# Patient Record
Sex: Female | Born: 1990 | Race: Black or African American | Hispanic: No | Marital: Single | Smoking: Former smoker
Health system: Southern US, Community
[De-identification: ages and names within clinical notes are randomized; demographics above are authoritative.]

## PROBLEM LIST (undated history)

## (undated) ENCOUNTER — Ambulatory Visit: Admission: RE | Payer: Medicaid Other | Source: Ambulatory Visit

## (undated) DIAGNOSIS — F1910 Other psychoactive substance abuse, uncomplicated: Secondary | ICD-10-CM

## (undated) DIAGNOSIS — R87629 Unspecified abnormal cytological findings in specimens from vagina: Secondary | ICD-10-CM

## (undated) DIAGNOSIS — O09299 Supervision of pregnancy with other poor reproductive or obstetric history, unspecified trimester: Secondary | ICD-10-CM

## (undated) DIAGNOSIS — Z8759 Personal history of other complications of pregnancy, childbirth and the puerperium: Secondary | ICD-10-CM

## (undated) HISTORY — PX: COLPOSCOPY: SHX161

---
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
## 2011-02-22 DIAGNOSIS — O09299 Supervision of pregnancy with other poor reproductive or obstetric history, unspecified trimester: Secondary | ICD-10-CM

## 2011-02-22 HISTORY — DX: Supervision of pregnancy with other poor reproductive or obstetric history, unspecified trimester: O09.299

## 2011-04-22 ENCOUNTER — Ambulatory Visit: Payer: Self-pay | Admitting: Advanced Practice Midwife

## 2011-06-30 ENCOUNTER — Encounter: Payer: Self-pay | Admitting: Obstetrics & Gynecology

## 2011-07-08 DIAGNOSIS — IMO0002 Reserved for concepts with insufficient information to code with codable children: Secondary | ICD-10-CM

## 2011-08-10 DIAGNOSIS — Z720 Tobacco use: Secondary | ICD-10-CM | POA: Insufficient documentation

## 2014-01-15 LAB — OB RESULTS CONSOLE HGB/HCT, BLOOD
HEMATOCRIT: 39 %
Hemoglobin: 13.6 g/dL

## 2014-01-15 LAB — OB RESULTS CONSOLE VARICELLA ZOSTER ANTIBODY, IGG: Varicella: IMMUNE

## 2014-01-15 LAB — OB RESULTS CONSOLE RPR: RPR: NONREACTIVE

## 2014-01-15 LAB — OB RESULTS CONSOLE HIV ANTIBODY (ROUTINE TESTING): HIV: NONREACTIVE

## 2014-01-15 LAB — OB RESULTS CONSOLE ABO/RH: RH Type: POSITIVE

## 2014-01-15 LAB — OB RESULTS CONSOLE RUBELLA ANTIBODY, IGM: Rubella: IMMUNE

## 2014-01-15 LAB — OB RESULTS CONSOLE ANTIBODY SCREEN: Antibody Screen: NEGATIVE

## 2014-01-15 LAB — OB RESULTS CONSOLE GC/CHLAMYDIA
CHLAMYDIA, DNA PROBE: NEGATIVE
Gonorrhea: NEGATIVE

## 2014-01-15 LAB — OB RESULTS CONSOLE HEPATITIS B SURFACE ANTIGEN: HEP B S AG: NEGATIVE

## 2014-01-17 LAB — OB RESULTS CONSOLE GC/CHLAMYDIA
Chlamydia: NEGATIVE
GC PROBE AMP, GENITAL: NEGATIVE

## 2014-01-23 ENCOUNTER — Encounter: Payer: Self-pay | Admitting: Obstetrics and Gynecology

## 2014-02-03 ENCOUNTER — Emergency Department: Payer: Self-pay | Admitting: Emergency Medicine

## 2014-02-05 ENCOUNTER — Emergency Department: Payer: Self-pay | Admitting: Emergency Medicine

## 2014-02-05 LAB — CBC
HCT: 42.8 % (ref 35.0–47.0)
HGB: 14.1 g/dL (ref 12.0–16.0)
MCH: 29.2 pg (ref 26.0–34.0)
MCHC: 32.8 g/dL (ref 32.0–36.0)
MCV: 89 fL (ref 80–100)
Platelet: 364 10*3/uL (ref 150–440)
RBC: 4.81 10*6/uL (ref 3.80–5.20)
RDW: 12.9 % (ref 11.5–14.5)
WBC: 14.9 10*3/uL — AB (ref 3.6–11.0)

## 2014-02-05 LAB — HCG, QUANTITATIVE, PREGNANCY: Beta Hcg, Quant.: 141417 m[IU]/mL — ABNORMAL HIGH

## 2014-02-06 ENCOUNTER — Encounter: Payer: Self-pay | Admitting: Obstetrics and Gynecology

## 2014-02-06 LAB — URINALYSIS, COMPLETE
BLOOD: NEGATIVE
Bacteria: NONE SEEN
Bilirubin,UR: NEGATIVE
Glucose,UR: NEGATIVE mg/dL (ref 0–75)
KETONE: NEGATIVE
Nitrite: NEGATIVE
PH: 6 (ref 4.5–8.0)
Protein: NEGATIVE
RBC,UR: 3 /HPF (ref 0–5)
Specific Gravity: 1.031 (ref 1.003–1.030)
WBC UR: 5 /HPF (ref 0–5)

## 2014-02-06 LAB — GC/CHLAMYDIA PROBE AMP

## 2014-02-06 LAB — WET PREP, GENITAL

## 2014-02-21 DIAGNOSIS — Z8759 Personal history of other complications of pregnancy, childbirth and the puerperium: Secondary | ICD-10-CM

## 2014-02-21 HISTORY — DX: Personal history of other complications of pregnancy, childbirth and the puerperium: Z87.59

## 2014-02-21 NOTE — L&D Delivery Note (Signed)
Delivery Note At 5:21 PM a viable unspecified sex was delivered via Vaginal, Spontaneous Delivery (Presentation:vertex).  APGAR: 8, 9; weight 4 lb 12.5 oz (2169 g).   Placenta status: Intact, Spontaneous Sent to Pathology d/t IUGR.  Cord: 3 vessels with the following complications: None.  Cord pH: N/A  Anesthesia: Epidural  Episiotomy: None Lacerations: None Suture Repair: N/A Est. Blood Loss (mL): 300  Approx 50 minute second stage. Infant to maternal abdomen for delayed cord clamping, cut by FOB after pulsation stopped. Infant to bedside warmer for assessment by nursery staff per mother's request.   [name]  Mom to postpartum.  Baby to Couplet care / Skin to Skin.  Kimberly Shea, Kimberly Shea 07/28/2014, 5:48 PM

## 2014-03-20 ENCOUNTER — Encounter: Payer: Self-pay | Admitting: Obstetrics and Gynecology

## 2014-03-24 ENCOUNTER — Encounter: Payer: Self-pay | Admitting: Obstetrics and Gynecology

## 2014-05-12 ENCOUNTER — Encounter
Admit: 2014-05-12 | Disposition: A | Discharge status: Home / Self Care | Payer: Self-pay | Attending: Obstetrics & Gynecology | Admitting: Obstetrics & Gynecology

## 2014-05-22 ENCOUNTER — Encounter
Admit: 2014-05-22 | Disposition: A | Discharge status: Home / Self Care | Payer: Self-pay | Attending: Obstetrics and Gynecology | Admitting: Obstetrics and Gynecology

## 2014-05-23 ENCOUNTER — Encounter
Admit: 2014-05-23 | Disposition: A | Discharge status: Home / Self Care | Payer: Self-pay | Attending: Obstetrics and Gynecology | Admitting: Obstetrics and Gynecology

## 2014-05-30 LAB — OB RESULTS CONSOLE HIV ANTIBODY (ROUTINE TESTING): HIV: NONREACTIVE

## 2014-05-30 LAB — OB RESULTS CONSOLE PLATELET COUNT: Platelets: 331 10*3/uL

## 2014-05-30 LAB — OB RESULTS CONSOLE RPR: RPR: NONREACTIVE

## 2014-06-05 ENCOUNTER — Encounter
Admit: 2014-06-05 | Disposition: A | Discharge status: Home / Self Care | Payer: Self-pay | Attending: Obstetrics & Gynecology | Admitting: Obstetrics & Gynecology

## 2014-06-12 ENCOUNTER — Encounter
Admit: 2014-06-12 | Disposition: A | Discharge status: Home / Self Care | Payer: Self-pay | Attending: Maternal & Fetal Medicine | Admitting: Maternal & Fetal Medicine

## 2014-06-14 NOTE — Consult Note (Signed)
Referral Information:  Reason for Referral 23yo G2P0100 at [redacted]w[redacted]d by LMP of 11/11/13 who presents for consult due to a prior history of a pregnancy complicated by severe FGR and fetal demise at 28 weeks.   Referring Physician [facility]   Prenatal Hx Current pregnancy is with a different FOB and has thus far been uncomplicated.  Prior pregnancy was complicated by severe FGR (patient does not recall at what EGA this was diagnosed).  She was seen at [facility] at [redacted]w[redacted]d at which time EFW was <3rd%, cardiomegaly and pericardial effusions were seen, there was oligohydramnios, concern for placental thickening and abnormal cord Dopplers.  She was admitted to [facility] for monitoring and steroids.  She underwent an amniocentesis for karyotype and infectious studies (normal female karyotype, negative parvovirus and CMV).  She was discharged after 1 day per her request and 1 week later was diagnosed with an IUFD.  She returned to [facility] for induction of labor and at 110w6d delivery delivered a 510 gram stillborn infant.  She had a negative acquired thrombophilia work up.  She declined autopsy; placental path demonstrated accelerated maturation, infarct and thrombus within the parenchyma.  Postpartum she had a Nexplanon placed which was removed last year.   Past Obstetrical Hx 2013 - NVD of stillborn 510 gram infant at [redacted]w[redacted]d at [facility] 2015 - current   Home Medications: Medication Instructions Status  Prenatal Multivitamins oral tablet 1 tab(s) orally once a day Active   Allergies:   No Known Allergies:   Vital Signs/Notes:  Nursing Vital Signs: **Vital Signs.:   03-Dec-15 10:47  Vital Signs Type Routine  Temperature Temperature (F) 97.7  Celsius 36.5  Temperature Source oral  Pulse Pulse 92  Respirations Respirations 18  Systolic BP Systolic BP 136  Diastolic BP (mmHg) Diastolic BP (mmHg) 80  Mean BP 98  Pulse Ox % Pulse Ox % 98  Pulse Ox Activity Level  At rest  Oxygen Delivery Room Air/ 21 %    Perinatal Consult:  LMP 11-Nov-2013   PGyn Hx Per records, patient has had an abnormal PAP s/p LEEP and has had chlamydia in the past.   PMed Hx Hx of varicella, Rubella result not available   Past Medical History cont'd Denies   PSurg Hx Denies   FHx MGM diagnosed with breast cancer age 31; alive and well.  No other significant family history per patient and her mother.   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  Denies ETOH (although use was reported until she found out she was pregnant); no drugs   Review Of Systems:  Fever/Chills No   Cough No   Abdominal Pain No   Diarrhea No   Constipation No   Nausea/Vomiting No   SOB/DOE No   Chest Pain No   Dysuria No   Tolerating Diet Yes   Medications/Allergies Reviewed Medications/Allergies reviewed   Impression/Recommendations:  Impression 23yo G2P0 at [redacted]w[redacted]d with history of severe FGR and subsequent IUFD last pregnancy 1.  hx of FGR and IUFD 2.  smoker 3.  ? early ETOH exposure 4.  Mildly elevated BP this visit (normal at HD) 5.  No rubella result in prenatal records   Recommendations 1.  We discussed the workup that was performed in 2013 including ultrasound, karyotype, infectious studies and placental pathology.  Given the thrombus and infarcts seen  in the placenta and the severe FGR, we discussed the recommendation for starting a baby ASA.  Acquired thrombophilia w/up was negative.  We discussed the importance of early dating US and she was offered first trimester screening (scheduled in 2 weeks).  Recommend anatomy US at about 18 weeks and then a late 2nd trimester US for growth given her early presentation with FGR her prior pregnancy (consider around 26 weeks). 2.  She was encouraged to quit smoking, especially in light of the FGR in her prior pregnancy. 3.  Will  schedule genetic counseling at the time of her first trimester screen - pending the amount of ETOH exposure and when she discontinued, consider screening fetal echocardiogram 4.  Follow up BPs at prenatal appointments; if elevated, consider baseline preeclampsia labs. 5.  Check rubella immune status.   Plan:  Prenatal Diagnosis Options First trimester   Ultrasound at what gestational ages 3626 and 3932-34 weeks   Antepartum Testing pending results of ultrasound   Delivery Mode Vaginal   Additional Testing Folate/prenatal vitamins    Total Time Spent with Patient 30 minutes   >50% of visit spent in couseling/coordination of care yes   Office Use Only 99242  Level 2 (30min) NEW office consult exp prob focused   Coding Description: MATERNAL CONDITIONS/HISTORY INDICATION(S).   History of prior preg w/ FGR/IUGR.   History of prior preg w/ Oligo or Poly.   Previous stillborn or neonatal death - Poor Reproductive History.  Electronic Signatures: Kirby FunkEllestad, Sotiria Keast (MD)  (Signed 03-Dec-15 12:26)  Authored: Referral, Home Medications, Allergies, Vital Signs/Notes, Consult, Exam, Impression, Plan, Billing, Coding Description   Last Updated: 03-Dec-15 12:26 by Kirby FunkEllestad, Jalil Lorusso (MD)

## 2014-06-19 ENCOUNTER — Encounter
Admit: 2014-06-19 | Disposition: A | Discharge status: Home / Self Care | Payer: Self-pay | Attending: Obstetrics and Gynecology | Admitting: Obstetrics and Gynecology

## 2014-07-03 ENCOUNTER — Other Ambulatory Visit: Payer: Self-pay | Admitting: Maternal & Fetal Medicine

## 2014-07-03 ENCOUNTER — Ambulatory Visit
Admission: RE | Admit: 2014-07-03 | Discharge: 2014-07-03 | Disposition: A | Discharge status: Home / Self Care | Payer: Medicaid Other | Source: Ambulatory Visit | Attending: Maternal & Fetal Medicine | Admitting: Maternal & Fetal Medicine

## 2014-07-03 ENCOUNTER — Other Ambulatory Visit: Payer: Self-pay | Admitting: *Deleted

## 2014-07-03 ENCOUNTER — Ambulatory Visit: Payer: Self-pay

## 2014-07-03 DIAGNOSIS — IMO0002 Reserved for concepts with insufficient information to code with codable children: Secondary | ICD-10-CM

## 2014-07-03 DIAGNOSIS — O364XX0 Maternal care for intrauterine death, not applicable or unspecified: Secondary | ICD-10-CM | POA: Diagnosis not present

## 2014-07-03 DIAGNOSIS — O365990 Maternal care for other known or suspected poor fetal growth, unspecified trimester, not applicable or unspecified: Secondary | ICD-10-CM | POA: Insufficient documentation

## 2014-07-03 DIAGNOSIS — Z36 Encounter for antenatal screening of mother: Secondary | ICD-10-CM | POA: Insufficient documentation

## 2014-07-07 ENCOUNTER — Observation Stay: Payer: Medicaid Other

## 2014-07-07 ENCOUNTER — Observation Stay
Admission: EM | Admit: 2014-07-07 | Discharge: 2014-07-07 | Disposition: A | Discharge status: Home / Self Care | Payer: Medicaid Other | Attending: Obstetrics and Gynecology | Admitting: Obstetrics and Gynecology

## 2014-07-07 ENCOUNTER — Encounter: Payer: Self-pay | Admitting: *Deleted

## 2014-07-07 DIAGNOSIS — Z3A34 34 weeks gestation of pregnancy: Secondary | ICD-10-CM | POA: Diagnosis not present

## 2014-07-07 DIAGNOSIS — IMO0002 Reserved for concepts with insufficient information to code with codable children: Secondary | ICD-10-CM

## 2014-07-07 DIAGNOSIS — O368130 Decreased fetal movements, third trimester, not applicable or unspecified: Principal | ICD-10-CM | POA: Insufficient documentation

## 2014-07-07 DIAGNOSIS — O288 Other abnormal findings on antenatal screening of mother: Secondary | ICD-10-CM | POA: Diagnosis present

## 2014-07-07 HISTORY — DX: Unspecified abnormal cytological findings in specimens from vagina: R87.629

## 2014-07-07 MED ORDER — ACETAMINOPHEN 325 MG PO TABS: 650.0000 mg | ORAL_TABLET | ORAL | Status: DC | PRN

## 2014-07-07 NOTE — Progress Notes (Signed)
[facility] Pt with 8/8 on BPP with a reactive NST for a total of 10/10. [facility]. Pt with DPN appt Thursday.

## 2014-07-07 NOTE — Plan of Care (Signed)
[facility] and [facility]

## 2014-07-07 NOTE — Discharge Summary (Signed)
Patient discharged home, discharge instructions reviewed, pt states understanding, pt left floor in stable conditions, denies any other needs at this time

## 2014-07-07 NOTE — OB Triage Note (Signed)
Patient sent from office for nonreactive NST.

## 2014-07-07 NOTE — H&P (Signed)
Obstetric History and Physical  Sharyn DrossShanice E Raul Dellston is a 24 y.o. G2P0100 with IUP at [redacted]w[redacted]d presenting for non-reactive NST in the office with decreased fetal movement. Patient states she has been having  none contractions, none vaginal bleeding, intact membranes, with decreased  fetal movement.    Prenatal Course Source of Care: [facility]  with onset of care at 24 weeks, but had previous care at [facility] starting at 9 weeks.  Pregnancy complications or risks: Pt with a history of IUFD at 26 weeks from IUGR in a previous pregnancy [facility], and is a smoker.  Patient Active Problem List   Diagnosis Date Noted  . Non-reactive NST (non-stress test) 07/07/2014    Prenatal labs and studies: ABO, Rh: O/Positive/-- (11/25 0000) Antibody: Negative (11/25 0000) Rubella: Immune (11/25 0000) Varicella: Immune  RPR: Nonreactive (04/08 0000)  HBsAg: Negative (11/25 0000)  HIV: Non-reactive (04/08 0000)  GBS:  unknown  Genetic screening TETRA negative  Anatomy US normal but had FGR at 27 weeks that has resolved on 5/12 Tdap: 06/13/14 Flu: NA   Past Medical History  Diagnosis Date  . Vaginal Pap smear, abnormal     Past Surgical History  Procedure Laterality Date  . Leep      OB History  Gravida Para Term Preterm AB SAB TAB Ectopic Multiple Living  2 1  1       0    # Outcome Date GA Lbr Len/2nd Weight Sex Delivery Anes PTL Lv  2 Current           1 Preterm 07/08/11    M Vag-Spont EPI       Complications: Fetal demise     Comments: fetal demise      History   Social History  . Marital Status: Single    Spouse Name: N/A  . Number of Children: N/A  . Years of Education: N/A   Social History Main Topics  . Smoking status: Not on file  . Smokeless tobacco: Former NeurosurgeonUser  . Alcohol Use: No  . Drug Use: No  . Sexual Activity: Yes    Birth Control/ Protection: Condom   Other Topics Concern  . Not on file   Social  History Narrative    Family History  Problem Relation Age of Onset  . Alcohol abuse Neg Hx   . Arthritis Neg Hx   . Asthma Neg Hx   . Birth defects Neg Hx   . Cancer Neg Hx   . COPD Neg Hx   . Depression Neg Hx   . Drug abuse Neg Hx   . Hyperlipidemia Neg Hx   . Varicose Veins Neg Hx   . Vision loss Neg Hx   . Miscarriages / Stillbirths Neg Hx   . Learning disabilities Neg Hx   . Kidney disease Neg Hx   . Hypertension Neg Hx   . Hearing loss Neg Hx   . Diabetes Neg Hx   . Early death Neg Hx   . Heart disease Neg Hx   . Mental illness Neg Hx   . Mental retardation Neg Hx   . Stroke Neg Hx     No prescriptions prior to admission    No Known Allergies  Review of Systems: Negative except for what is mentioned in HPI.  Physical Exam: BP 128/80 mmHg  Pulse 99  Temp(Src) 98.1 F (36.7 C) (Oral)  Resp 18  Ht 5\' 5"  (1.651 m)  Wt 78.926 kg (174 lb)  BMI 28.96 kg/m2 GENERAL: Well-developed, well-nourished female in no acute distress.  LUNGS: Clear to auscultation bilaterally.  HEART: Regular rate and rhythm. ABDOMEN: Soft, nontender, nondistended, gravid. EXTREMITIES: Nontender, no edema, 2+ distal pulses.   Presentation: NA FHT:  Baseline rate 135 bpm   Variability moderate  Accelerations present   Decelerations none Contractions: none Pertinent Labs/Studies:   No results found for this or any previous visit (from the past 24 hour(s)).  Assessment : Lashica E Repass is a 24 y.o. G2P0100 at 2848w0d bRoselind Messiereing observed for non-reactive FHT    Plan: Dopplers and BPP performed. Being read by Dr. Dolphus JennySmall currently.  FWB: Reactive NST. Reassuring fetal heart tracing. Dispo: pending US report. IF normal [facility] and to have pt keep her prenatal appointment.   Jannet Mantisourtney Danish Ruffins, CNM [facility]

## 2014-07-07 NOTE — Plan of Care (Signed)
Alric Quan Subudhi, CNM consult with [facility], [facility].

## 2014-07-07 NOTE — Discharge Instructions (Signed)
Drink plenty of fluid and get plenty of rest. Call your provider for any other concerns °

## 2014-07-10 ENCOUNTER — Ambulatory Visit
Admission: RE | Admit: 2014-07-10 | Discharge: 2014-07-10 | Disposition: A | Discharge status: Home / Self Care | Payer: Medicaid Other | Source: Ambulatory Visit | Attending: Obstetrics and Gynecology | Admitting: Obstetrics and Gynecology

## 2014-07-10 ENCOUNTER — Other Ambulatory Visit: Payer: Self-pay

## 2014-07-10 DIAGNOSIS — Z3A33 33 weeks gestation of pregnancy: Secondary | ICD-10-CM | POA: Insufficient documentation

## 2014-07-10 DIAGNOSIS — O365930 Maternal care for other known or suspected poor fetal growth, third trimester, not applicable or unspecified: Secondary | ICD-10-CM | POA: Diagnosis present

## 2014-07-10 DIAGNOSIS — IMO0002 Reserved for concepts with insufficient information to code with codable children: Secondary | ICD-10-CM

## 2014-07-17 ENCOUNTER — Other Ambulatory Visit: Payer: Self-pay | Admitting: Maternal & Fetal Medicine

## 2014-07-17 ENCOUNTER — Ambulatory Visit
Admission: RE | Admit: 2014-07-17 | Discharge: 2014-07-17 | Disposition: A | Discharge status: Home / Self Care | Payer: Medicaid Other | Source: Ambulatory Visit | Attending: Maternal & Fetal Medicine | Admitting: Maternal & Fetal Medicine

## 2014-07-17 ENCOUNTER — Inpatient Hospital Stay
Admission: RE | Admit: 2014-07-17 | Discharge: 2014-07-17 | Disposition: A | Discharge status: Home / Self Care | Payer: Self-pay | Source: Ambulatory Visit | Attending: Maternal & Fetal Medicine | Admitting: Maternal & Fetal Medicine

## 2014-07-17 DIAGNOSIS — O365930 Maternal care for other known or suspected poor fetal growth, third trimester, not applicable or unspecified: Secondary | ICD-10-CM | POA: Insufficient documentation

## 2014-07-17 DIAGNOSIS — IMO0002 Reserved for concepts with insufficient information to code with codable children: Secondary | ICD-10-CM

## 2014-07-23 LAB — OB RESULTS CONSOLE GBS: GBS: NEGATIVE

## 2014-07-24 ENCOUNTER — Ambulatory Visit
Admission: RE | Admit: 2014-07-24 | Discharge: 2014-07-24 | Disposition: A | Discharge status: Home / Self Care | Payer: Medicaid Other | Source: Ambulatory Visit | Attending: Maternal & Fetal Medicine | Admitting: Maternal & Fetal Medicine

## 2014-07-24 DIAGNOSIS — O09293 Supervision of pregnancy with other poor reproductive or obstetric history, third trimester: Secondary | ICD-10-CM

## 2014-07-24 DIAGNOSIS — Z3A36 36 weeks gestation of pregnancy: Secondary | ICD-10-CM | POA: Insufficient documentation

## 2014-07-24 DIAGNOSIS — O09299 Supervision of pregnancy with other poor reproductive or obstetric history, unspecified trimester: Secondary | ICD-10-CM | POA: Diagnosis present

## 2014-07-24 DIAGNOSIS — IMO0002 Reserved for concepts with insufficient information to code with codable children: Secondary | ICD-10-CM

## 2014-07-24 LAB — US OB FOLLOW UP

## 2014-07-27 ENCOUNTER — Inpatient Hospital Stay
Admission: EM | Admit: 2014-07-27 | Discharge: 2014-07-30 | DRG: 775 | Disposition: A | Discharge status: Home / Self Care | Payer: Medicaid Other | Attending: Certified Nurse Midwife | Admitting: Certified Nurse Midwife

## 2014-07-27 DIAGNOSIS — O365930 Maternal care for other known or suspected poor fetal growth, third trimester, not applicable or unspecified: Secondary | ICD-10-CM | POA: Diagnosis present

## 2014-07-27 DIAGNOSIS — Z87891 Personal history of nicotine dependence: Secondary | ICD-10-CM

## 2014-07-27 DIAGNOSIS — Z7982 Long term (current) use of aspirin: Secondary | ICD-10-CM | POA: Diagnosis present

## 2014-07-27 DIAGNOSIS — Z3A37 37 weeks gestation of pregnancy: Secondary | ICD-10-CM | POA: Diagnosis present

## 2014-07-27 DIAGNOSIS — Z79899 Other long term (current) drug therapy: Secondary | ICD-10-CM

## 2014-07-27 DIAGNOSIS — Z3490 Encounter for supervision of normal pregnancy, unspecified, unspecified trimester: Secondary | ICD-10-CM

## 2014-07-27 HISTORY — DX: Supervision of pregnancy with other poor reproductive or obstetric history, unspecified trimester: O09.299

## 2014-07-27 LAB — CBC
HCT: 40.3 % (ref 35.0–47.0)
Hemoglobin: 13.6 g/dL (ref 12.0–16.0)
MCH: 28.6 pg (ref 26.0–34.0)
MCHC: 33.7 g/dL (ref 32.0–36.0)
MCV: 85 fL (ref 80.0–100.0)
PLATELETS: 263 10*3/uL (ref 150–440)
RBC: 4.74 MIL/uL (ref 3.80–5.20)
RDW: 13.7 % (ref 11.5–14.5)
WBC: 11.7 10*3/uL — ABNORMAL HIGH (ref 3.6–11.0)

## 2014-07-27 LAB — TYPE AND SCREEN
ABO/RH(D): O POS
Antibody Screen: NEGATIVE

## 2014-07-27 MED ORDER — OXYTOCIN 40 UNITS IN LACTATED RINGERS INFUSION - SIMPLE MED: 62.5000 mL/h | INTRAVENOUS | Status: DC

## 2014-07-27 MED ORDER — LIDOCAINE HCL (PF) 1 % IJ SOLN
30.0000 mL | INTRAMUSCULAR | Status: DC | PRN
  Filled 2014-07-27: qty 30

## 2014-07-27 MED ORDER — ONDANSETRON HCL 4 MG/2ML IJ SOLN
4.0000 mg | Freq: Four times a day (QID) | INTRAMUSCULAR | Status: DC | PRN
  Administered 2014-07-28: 4 mg via INTRAVENOUS

## 2014-07-27 MED ORDER — TRIAZOLAM 0.125 MG PO TABS: 0.2500 mg | ORAL_TABLET | Freq: Every evening | ORAL | Status: DC | PRN

## 2014-07-27 MED ORDER — AMMONIA AROMATIC IN INHA: 0.3000 mL | Freq: Once | RESPIRATORY_TRACT | Status: AC | PRN

## 2014-07-27 MED ORDER — LACTATED RINGERS IV SOLN
INTRAVENOUS | Status: DC
  Administered 2014-07-27 – 2014-07-28 (×2): via INTRAVENOUS

## 2014-07-27 MED ORDER — DINOPROSTONE 10 MG VA INST
10.0000 mg | VAGINAL_INSERT | Freq: Once | VAGINAL | Status: AC
  Administered 2014-07-27: 10 mg via VAGINAL
  Filled 2014-07-27: qty 1

## 2014-07-27 MED ORDER — OXYTOCIN BOLUS FROM INFUSION
500.0000 mL | INTRAVENOUS | Status: DC
  Administered 2014-07-28: 500 mL via INTRAVENOUS

## 2014-07-27 MED ORDER — LACTATED RINGERS IV SOLN
500.0000 mL | INTRAVENOUS | Status: DC | PRN
  Administered 2014-07-28: 500 mL via INTRAVENOUS
  Administered 2014-07-28: 250 mL via INTRAVENOUS

## 2014-07-27 MED ORDER — OXYTOCIN 10 UNIT/ML IJ SOLN: 10.0000 [IU] | Freq: Once | INTRAMUSCULAR | Status: DC

## 2014-07-27 MED ORDER — MISOPROSTOL 200 MCG PO TABS: 800.0000 ug | ORAL_TABLET | Freq: Once | ORAL | Status: AC | PRN

## 2014-07-27 MED ORDER — TERBUTALINE SULFATE 1 MG/ML IJ SOLN: 0.2500 mg | Freq: Once | INTRAMUSCULAR | Status: AC | PRN

## 2014-07-27 NOTE — H&P (Signed)
HISTORY AND PHYSICAL  HISTORY OF PRESENT ILLNESS: [name] is a 24 y.o. G2 P0100 with EDC=08/18/2014  by LMP consistent with 12 week ultrasound with a pregnancy complicated by concerns for FGR  presenting for induction of labor. Kimberly has a hx of a stillbirth  at 72 weeks with a baby with hydrops, FGR, oligohydraminos. Prenatal care begun at [facility] and transferred to [facility] at 24 weeks. POC co [facility] by [facility]. Antepartum testing begun at 28 weeks after growth scan placed EFW in the <5th %. BPPs and Dopplers have been normal. Earlier NSTs reactive with 10x10 accels, but have had some NR NSTs requiring prolonged monitoring. Weight gain this pregnancy around 15#.    REVIEW OF SYSTEMS: A complete review of systems was performed and was specifically negative for headache, changes in vision, RUQ pain, shortness of breath, chest pain, lower extremity edema and dysuria.   HISTORY:  Past Medical History  Diagnosis Date  . Vaginal Pap smear, abnormal   . History of stillbirth in currently pregnant patient   . History of IUGR (intrauterine growth retardation) and stillbirth, currently pregnant     Past Surgical History  Procedure Laterality Date  . Leep      No current facility-administered medications on file prior to encounter.   Current Outpatient Prescriptions on File Prior to Encounter  Medication Sig Dispense Refill  . aspirin 81 MG tablet Take 81 mg by mouth daily.    . Prenatal Vit-Fe Fumarate-FA (MULTIVITAMIN-PRENATAL) 27-0.8 MG TABS tablet Take 1 tablet by mouth daily at 12 noon.       No Known Allergies  OB History  Gravida Para Term Preterm AB SAB TAB Ectopic Multiple Living  0    # Outcome Date GA Lbr Len/2nd Weight Sex Delivery Anes PTL Lv  2 Current           1 Preterm 07/08/11 [redacted]w[redacted]d   M Vag-Spont EPI  FD     Complications: Fetal demise,Fetal hydrops,Oligohydramnios     Comments: fetal demise      Gynecologic History: History of Abnormal  Pap Smear: Yes-2008 LGSIL History of STI: No  History  Substance Use Topics  . Smoking status: Former Smoker    Types: Cigarettes  . Smokeless tobacco: Former Neurosurgeon  . Alcohol Use: No    PHYSICAL EXAM:    GENERAL: NAD AAOx3 CHEST:CTAB no increased work of breathing CV:RRR no appreciable murmurs, rubs, gallops ABDOMEN: gravid, nontender, EFW 5#5oz by [place] 4 days ago EXTREMITIES:  Warm and well-perfused, nontender, nonedematous, +1 DTRs CERVIX: 1.5/ 60%/ -1/vtx   FHT:150 baseline with moderate variability and accelerations to 170s and no decelerations  Toco: No contractions seen  DIAGNOSTIC STUDIES:   PRENATAL STUDIES:  Prenatal Labs:  MBT: O POS; Rubella immune (MMRx2), Varicella immune, HIV neg, RPR neg, Hep B neg, GC/CT neg, GBS neg, glucola 116  Last [place] 36wks 5#5oz (16th%ile)  AF wnl, normal anatomy  ASSESSMENT AND PLAN:  1. Fetal Well being  - Fetal Tracing: Cat 1-  - Group B Streptococcus: neg - Presentation: vtx confirmed by exam   2. Routine OB: - Prenatal labs reviewed, as above - Rh POS -TDAP given 4/22  3. Induction of Labor:  -  Bishop score 7 -  Pelvis adequate on exam -  Plan for cervical ripening with Cervidil. Explained risks of induction vs risks of continued observation. Aware of risks of hyperstimulation, fetal intolerance, C-section, and failed  induction. Patient adamant about proceeding with IOL.  4. Post Partum Planning: - Infant feeding: Breast- Contraception: condoms

## 2014-07-28 ENCOUNTER — Inpatient Hospital Stay: Payer: Medicaid Other | Admitting: Anesthesiology

## 2014-07-28 LAB — CHLAMYDIA/NGC RT PCR (ARMC ONLY)
Chlamydia Tr: NOT DETECTED
N GONORRHOEAE: NOT DETECTED

## 2014-07-28 LAB — ABO/RH: ABO/RH(D): O POS

## 2014-07-28 MED ORDER — FERROUS SULFATE 325 (65 FE) MG PO TABS
325.0000 mg | ORAL_TABLET | Freq: Every day | ORAL | Status: DC
  Administered 2014-07-29 – 2014-07-30 (×2): 325 mg via ORAL
  Filled 2014-07-28 (×2): qty 1

## 2014-07-28 MED ORDER — DIPHENHYDRAMINE HCL 50 MG/ML IJ SOLN: 12.5000 mg | INTRAMUSCULAR | Status: DC | PRN

## 2014-07-28 MED ORDER — OXYTOCIN 40 UNITS IN LACTATED RINGERS INFUSION - SIMPLE MED
INTRAVENOUS | Status: AC
  Filled 2014-07-28: qty 1000

## 2014-07-28 MED ORDER — DOCUSATE SODIUM 100 MG PO CAPS: 100.0000 mg | ORAL_CAPSULE | Freq: Two times a day (BID) | ORAL | Status: DC | PRN

## 2014-07-28 MED ORDER — PHENYLEPHRINE 40 MCG/ML (10ML) SYRINGE FOR IV PUSH (FOR BLOOD PRESSURE SUPPORT)
80.0000 ug | PREFILLED_SYRINGE | INTRAVENOUS | Status: DC | PRN
  Filled 2014-07-28: qty 2

## 2014-07-28 MED ORDER — FENTANYL 2.5 MCG/ML W/ROPIVACAINE 0.2% IN NS 100 ML EPIDURAL INFUSION (ARMC-ANES)
10.0000 mL/h | EPIDURAL | Status: DC
  Administered 2014-07-28: 10 mL/h via EPIDURAL

## 2014-07-28 MED ORDER — EPHEDRINE 5 MG/ML INJ
10.0000 mg | INTRAVENOUS | Status: DC | PRN
  Filled 2014-07-28: qty 2

## 2014-07-28 MED ORDER — OXYTOCIN 10 UNIT/ML IJ SOLN
INTRAMUSCULAR | Status: AC
  Filled 2014-07-28: qty 2

## 2014-07-28 MED ORDER — ONDANSETRON HCL 4 MG PO TABS: 4.0000 mg | ORAL_TABLET | ORAL | Status: DC | PRN

## 2014-07-28 MED ORDER — DIBUCAINE 1 % RE OINT: 1.0000 "application " | TOPICAL_OINTMENT | RECTAL | Status: DC | PRN

## 2014-07-28 MED ORDER — MISOPROSTOL 200 MCG PO TABS
ORAL_TABLET | ORAL | Status: AC
  Filled 2014-07-28: qty 4

## 2014-07-28 MED ORDER — BENZOCAINE-MENTHOL 20-0.5 % EX AERO: 1.0000 "application " | INHALATION_SPRAY | CUTANEOUS | Status: DC | PRN

## 2014-07-28 MED ORDER — BUPIVACAINE HCL (PF) 0.25 % IJ SOLN
INTRAMUSCULAR | Status: DC | PRN
  Administered 2014-07-28 (×2): 4 mL

## 2014-07-28 MED ORDER — SIMETHICONE 80 MG PO CHEW: 80.0000 mg | CHEWABLE_TABLET | ORAL | Status: DC | PRN

## 2014-07-28 MED ORDER — HYDROCODONE-ACETAMINOPHEN 5-325 MG PO TABS
1.0000 | ORAL_TABLET | Freq: Four times a day (QID) | ORAL | Status: DC | PRN
  Administered 2014-07-28: 1 via ORAL
  Administered 2014-07-29 – 2014-07-30 (×4): 2 via ORAL
  Filled 2014-07-28 (×4): qty 2
  Filled 2014-07-28: qty 1

## 2014-07-28 MED ORDER — PRENATAL 27-0.8 MG PO TABS
1.0000 | ORAL_TABLET | Freq: Every day | ORAL | Status: DC
  Filled 2014-07-28 (×4): qty 1

## 2014-07-28 MED ORDER — DIPHENHYDRAMINE HCL 25 MG PO CAPS: 25.0000 mg | ORAL_CAPSULE | Freq: Four times a day (QID) | ORAL | Status: DC | PRN

## 2014-07-28 MED ORDER — ONDANSETRON HCL 4 MG/2ML IJ SOLN
INTRAMUSCULAR | Status: AC
  Administered 2014-07-28: 4 mg via INTRAVENOUS
  Filled 2014-07-28: qty 2

## 2014-07-28 MED ORDER — WITCH HAZEL-GLYCERIN EX PADS: 1.0000 "application " | MEDICATED_PAD | CUTANEOUS | Status: DC | PRN

## 2014-07-28 MED ORDER — LACTATED RINGERS IV SOLN: INTRAVENOUS | Status: DC

## 2014-07-28 MED ORDER — IBUPROFEN 600 MG PO TABS
600.0000 mg | ORAL_TABLET | Freq: Four times a day (QID) | ORAL | Status: DC
  Administered 2014-07-28: 600 mg via ORAL
  Filled 2014-07-28: qty 1

## 2014-07-28 MED ORDER — LIDOCAINE HCL (PF) 1 % IJ SOLN
INTRAMUSCULAR | Status: AC
  Filled 2014-07-28: qty 30

## 2014-07-28 MED ORDER — IBUPROFEN 600 MG PO TABS
600.0000 mg | ORAL_TABLET | Freq: Four times a day (QID) | ORAL | Status: DC
  Administered 2014-07-29 – 2014-07-30 (×6): 600 mg via ORAL
  Filled 2014-07-28 (×6): qty 1

## 2014-07-28 MED ORDER — LANOLIN HYDROUS EX OINT: TOPICAL_OINTMENT | CUTANEOUS | Status: DC | PRN

## 2014-07-28 MED ORDER — FENTANYL 2.5 MCG/ML W/ROPIVACAINE 0.2% IN NS 100 ML EPIDURAL INFUSION (ARMC-ANES)
EPIDURAL | Status: AC
  Administered 2014-07-28: 10 mL/h via EPIDURAL
  Filled 2014-07-28: qty 100

## 2014-07-28 MED ORDER — AMMONIA AROMATIC IN INHA
RESPIRATORY_TRACT | Status: AC
  Filled 2014-07-28: qty 10

## 2014-07-28 MED ORDER — ZOLPIDEM TARTRATE 5 MG PO TABS: 5.0000 mg | ORAL_TABLET | Freq: Every evening | ORAL | Status: DC | PRN

## 2014-07-28 MED ORDER — ACETAMINOPHEN 325 MG PO TABS: 650.0000 mg | ORAL_TABLET | ORAL | Status: DC | PRN

## 2014-07-28 MED ORDER — ONDANSETRON HCL 4 MG/2ML IJ SOLN: 4.0000 mg | INTRAMUSCULAR | Status: DC | PRN

## 2014-07-28 MED ORDER — PRENATAL MULTIVITAMIN CH
1.0000 | ORAL_TABLET | Freq: Every day | ORAL | Status: DC
  Administered 2014-07-29 – 2014-07-30 (×2): 1 via ORAL

## 2014-07-28 MED ORDER — LIDOCAINE-EPINEPHRINE (PF) 1.5 %-1:200000 IJ SOLN
INTRAMUSCULAR | Status: DC | PRN
  Administered 2014-07-28: 3 mL

## 2014-07-28 NOTE — Progress Notes (Signed)
L&D Note  07/28/2014 - 1:56 PM  24 y.o. G2P0100 567w0d   [name] is admitted for IOL for IUGR and h/o IUFD   Subjective:  Comfortable with epidural, mild cramping in LLQ  Objective:   Filed Vitals:   07/28/14 1037 07/28/14 1137 07/28/14 1237 07/28/14 1302  BP: 119/75 125/72 121/76   Pulse: 88 92 100   Temp:    98.4 F (36.9 C)  TempSrc:    Oral  Resp:      Height:      Weight:      SpO2:        Current Vital Signs 24h Vital Sign Ranges  T 98.4 F (36.9 C) Temp  Avg: 98.4 F (36.9 C)  Min: 97.8 F (36.6 C)  Max: 99 F (37.2 C)  BP 121/76 mmHg BP  Min: 112/56  Max: 141/90  HR 100 Pulse  Avg: 84.9  Min: 72  Max: 100  RR 18 Resp  Avg: 18  Min: 18  Max: 18  SaO2 99 %   SpO2  Avg: 99 %  Min: 99 %  Max: 99 %       24 Hour I/O Current Shift I/O  Time Ins Outs 06/05 0701 - 06/06 0700 In: 567.5 [I.V.:567.5] Out: -       FHR: 140, min- mod variability, + accels, no decels Toco: q 2-4 min SVE: 9/100/-1, BBOW   Assessment :  IUP 37 wks, IOL for FGR    Plan:  Hold on AROM d/t conflicting emergency on unit.  Plan for delivery soon  Marta AntuBrothers, Mekhi Lascola, [place]

## 2014-07-28 NOTE — Anesthesia Preprocedure Evaluation (Addendum)
Anesthesia Evaluation  Patient identified by MRN, date of birth, ID band Patient awake    Reviewed: Allergy & Precautions, H&P , NPO status , Patient's Chart, lab work & pertinent test results, reviewed documented beta blocker date and time   History of Anesthesia Complications Negative for: history of anesthetic complications  Airway Mallampati: II  TM Distance: >3 FB Neck ROM: full    Dental no notable dental hx.    Pulmonary neg pulmonary ROS, former smoker,  breath sounds clear to auscultation  Pulmonary exam normal       Cardiovascular Exercise Tolerance: Good negative cardio ROS  Rhythm:regular Rate:Normal     Neuro/Psych negative neurological ROS  negative psych ROS   GI/Hepatic negative GI ROS, Neg liver ROS,   Endo/Other  negative endocrine ROS  Renal/GU negative Renal ROS  negative genitourinary   Musculoskeletal   Abdominal   Peds  Hematology negative hematology ROS (+)   Anesthesia Other Findings   Reproductive/Obstetrics (+) Pregnancy                             Anesthesia Physical Anesthesia Plan  ASA: II  Anesthesia Plan: Epidural   Post-op Pain Management:    Induction:   Airway Management Planned:   Additional Equipment:   Intra-op Plan:   Post-operative Plan:   Informed Consent: I have reviewed the patients History and Physical, chart, labs and discussed the procedure including the risks, benefits and alternatives for the proposed anesthesia with the patient or authorized representative who has indicated his/her understanding and acceptance.     Plan Discussed with:   Anesthesia Plan Comments:         Anesthesia Quick Evaluation

## 2014-07-28 NOTE — Progress Notes (Signed)
S: I couldn't take it anymore! Feels better after epidural, but nauseous Awoke with contractions around 0200, they became stronger at 0400 CX was 3.5cm dilated on RN exam so Cervidil was removed and epidural was obtained. O: BP 118/62 mmHg  Pulse 86  Temp(Src) 99 F (37.2 C) (Oral)  Resp 18  Ht 5\' 5"  (1.651 m)  Wt 78.926 kg (174 lb)  BMI 28.96 kg/m2  LMP 11/11/2013 FHR 135-145, no decels Toco: contractions q2-5 min CX: 4/80-90%/-2/BBOW A: Progressing P: Zofran for nausea Foley Monitor progress-consider AROM and IUPC Johnatan Baskette, CNM

## 2014-07-28 NOTE — Anesthesia Procedure Notes (Signed)
Epidural Patient location during procedure: OB Start time: 07/28/2014 5:40 AM End time: 07/28/2014 5:49 AM  Staffing Anesthesiologist: Forestine ChutePOLIN, Daelin Haste Performed by: anesthesiologist   Preanesthetic Checklist Completed: patient identified, site marked, surgical consent, pre-op evaluation, timeout performed, IV checked, risks and benefits discussed and monitors and equipment checked  Epidural Patient position: sitting Prep: Betadine Patient monitoring: heart rate and blood pressure Approach: midline Location: L3-L4 Injection technique: LOR saline  Needle:  Needle type: Tuohy  Needle gauge: 18 G Needle length: 9 cm Needle insertion depth: 6 cm Catheter type: closed end Catheter size: 20 Guage Catheter at skin depth: 11 cm Test dose: negative  Assessment Events: blood not aspirated, injection not painful and no paresthesia  Additional Notes Reason for block:procedure for pain

## 2014-07-29 LAB — RPR: RPR Ser Ql: NONREACTIVE

## 2014-07-29 LAB — HEMATOCRIT: HCT: 37.3 % (ref 35.0–47.0)

## 2014-07-29 NOTE — Anesthesia Postprocedure Evaluation (Signed)
  Anesthesia Post-op Note  Patient: Kimberly Shea  Procedure(s) Performed: * No procedures listed *  Anesthesia type:No value filed.  Patient location: PACU  Post pain: Pain level controlled  Post assessment: Post-op Vital signs reviewed, Patient's Cardiovascular Status Stable, Respiratory Function Stable, Patent Airway and No signs of Nausea or vomiting  Post vital signs: Reviewed and stable  Last Vitals:  Filed Vitals:   07/29/14 0430  BP: 104/66  Pulse: 78  Temp: 36.6 C  Resp: 18    Level of consciousness: awake, alert  and patient cooperative  Complications: No apparent anesthesia complications

## 2014-07-29 NOTE — Progress Notes (Signed)
Post Partum Day 1 from a SVD Subjective: Reports being tired.    Objective: Blood pressure 126/83, pulse 86, temperature 97.8 F (36.6 C), temperature source Oral, resp. rate 18, height 5\' 5"  (1.651 m), weight 78.926 kg (174 lb), last menstrual period 11/11/2013, SpO2 99 %, unknown if currently breastfeeding.  Pt with covers up by her face during the exam.   Physical Exam:  General: alert, cooperative and no distress Lochia: appropriate Uterine Fundus: firm Incision: NA DVT Evaluation: No evidence of DVT seen on physical exam.   Recent Labs  07/27/14 2124 07/29/14 0635  HGB 13.6  --   HCT 40.3 37.3    Assessment/Plan: Plan for discharge tomorrow and Contraception condoms   Pt switched to bottle feeding    LOS: 2 days   Jannet MantisSubudhi,  Josselyne Onofrio 07/29/2014, 10:42 AM

## 2014-07-30 LAB — SURGICAL PATHOLOGY

## 2014-07-30 MED ORDER — IBUPROFEN 600 MG PO TABS: 600.0000 mg | ORAL_TABLET | Freq: Four times a day (QID) | ORAL | Status: DC

## 2014-07-30 MED ORDER — HYDROCODONE-ACETAMINOPHEN 5-325 MG PO TABS: 1.0000 | ORAL_TABLET | Freq: Four times a day (QID) | ORAL | Status: DC | PRN

## 2014-07-30 NOTE — Discharge Summary (Signed)
Obstetrical Discharge Summary  Date of Admission: 07/27/2014 Date of Discharge: 07/30/2014 Primary OB: [facility]  Gestational Age at Delivery: 1374w0d   Antepartum complications: intrauterine growth restriction Reason for Admission: IOL for IUGR and h/o IUFD Date of Delivery: 07/28/14   Delivered By: T. Brothers, CNM Delivery Type: spontaneous vaginal delivery Intrapartum complications/course: None Anesthesia: epidural Placenta: spontaneous Laceration: none  Episiotomy: none Baby: Liveborn female, APGARs8/9, weight 2169 g/4#12.5 oz  Post partum course: uncomplicated. Pt discharged on PPD 2.  Disposition: Home  Rh Immune globulin given: not applicable Rubella vaccine given: not applicable Tdap vaccine given in AP or PP setting: yes Flu vaccine given in AP or PP setting: not applicable  Contraception: to be discussed- pt reports wanting to use condoms  Prenatal/Postnatal Panel: O POS//Rubella Immune//RPR negative//HIV negative/HepB Surface Ag negative//pap //plans to bottle feed  Plan:  Roselind MessierShanice E Vroom was discharged to home in good condition. [facility]   Discharge Medications:   Medication List    STOP taking these medications        aspirin 81 MG chewable tablet      TAKE these medications        HYDROcodone-acetaminophen 5-325 MG per tablet  Commonly known as:  NORCO/VICODIN  Take 1-2 tablets by mouth every 6 (six) hours as needed for severe pain.     ibuprofen 600 MG tablet  Commonly known as:  ADVIL,MOTRIN  Take 1 tablet (600 mg total) by mouth every 6 (six) hours.     multivitamin-prenatal 27-0.8 MG Tabs tablet  Take 1 tablet by mouth daily.        Jannet Mantisourtney Lacee Grey, CNM  [facility]

## 2014-07-30 NOTE — Progress Notes (Signed)
All discharge instructions given to patient and she voiced understanding of all instructions given. She will make her own 6 wk f/u appt.  Prescriptions given. Patient has been discharged but will remain here with infant as infant is now a pediatric patient due to high bili and is under lights.

## 2014-07-30 NOTE — Discharge Instructions (Signed)
Follow up sooner with fever, problems breathing, pain not helped by medications, severe depression( more than just baby blues, wanting to hurt yourself or the baby), severe bleeding ( saturating more than one pad an hour or large palm sized clots), no heavy lifting , no driving while taking narcotics, no douches, intercourse, tampons or enemas for 6 weeks  °

## 2014-07-31 ENCOUNTER — Other Ambulatory Visit: Payer: Self-pay

## 2014-08-07 ENCOUNTER — Other Ambulatory Visit: Payer: Self-pay

## 2014-08-14 ENCOUNTER — Ambulatory Visit: Payer: Self-pay

## 2014-08-21 ENCOUNTER — Other Ambulatory Visit: Payer: Self-pay

## 2016-02-22 NOTE — L&D Delivery Note (Addendum)
Delivery Note Primary OB: [facility] Delivery Physician: Annamarie Major, MD Gestational Age: Full term Antepartum complications: Delayed prenatal care, substance use, IUGR Intrapartum complications: None  A viable Female was delivered via vertex perentation.  Right lower extremity defect.  Apgars:5 lbs 9 oz  Weight:  pending .   Placenta status: spontaneous and Intact.  Cord: 3+ vessels;  with the following complications: none.  Anesthesia:  epidural Episiotomy:  none Lacerations:  none Suture Repair: none Est. Blood Loss (mL):  less than 50 mL  Mom to postpartum.  Baby to Couplet care / Skin to Skin.  Annamarie Major, MD, [facility], [facility] 12/02/2016  6:15 AM [phone]

## 2016-06-07 IMAGING — US US OB < 14 WEEKS
1 series · 14 of 28 positions shown · non-contrast
Comparison: None of this gestation

CLINICAL DATA: Vaginal bleeding in first trimester pregnancy

EXAM:
OBSTETRIC <14 WK ULTRASOUND
TECHNIQUE: Transabdominal ultrasound was performed for evaluation of the
gestation as well as the maternal uterus and adnexal regions.

[Series 1: us ob < 14 weeks · 0.20mm/px · 14 of 37 slices shown]
[im 2/37]
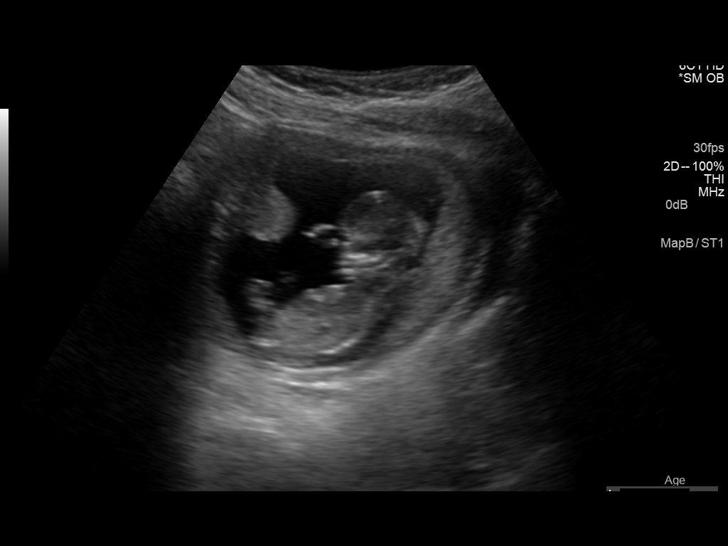
[im 5/37]
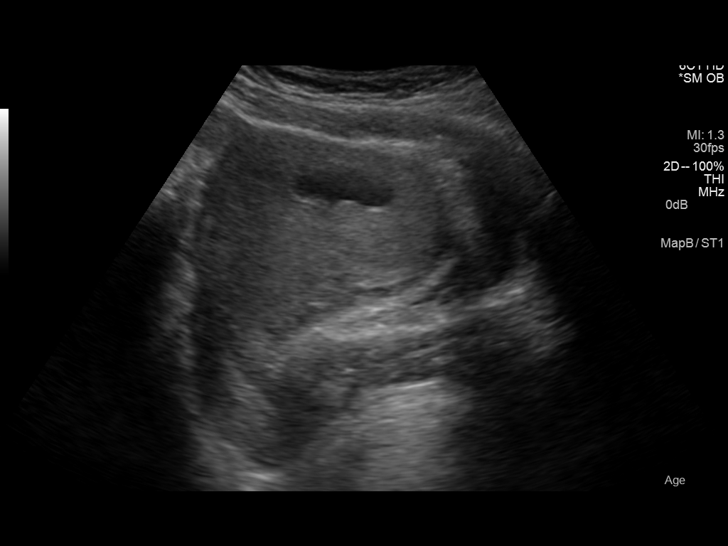
[im 7/37]
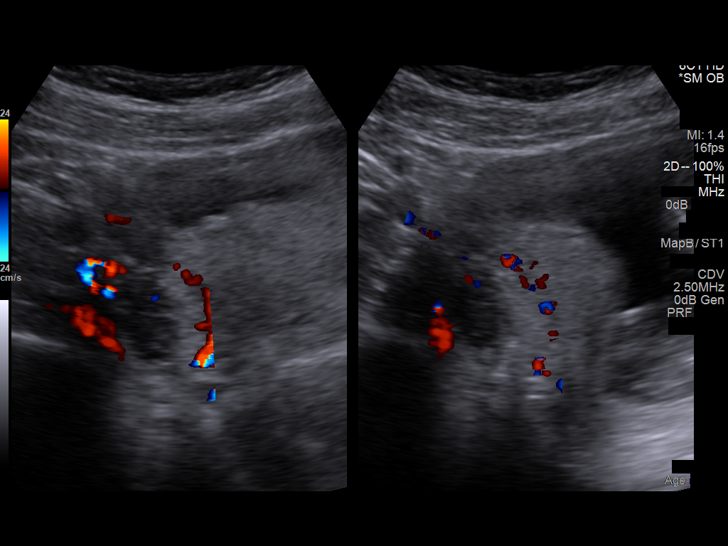
[im 10/37]
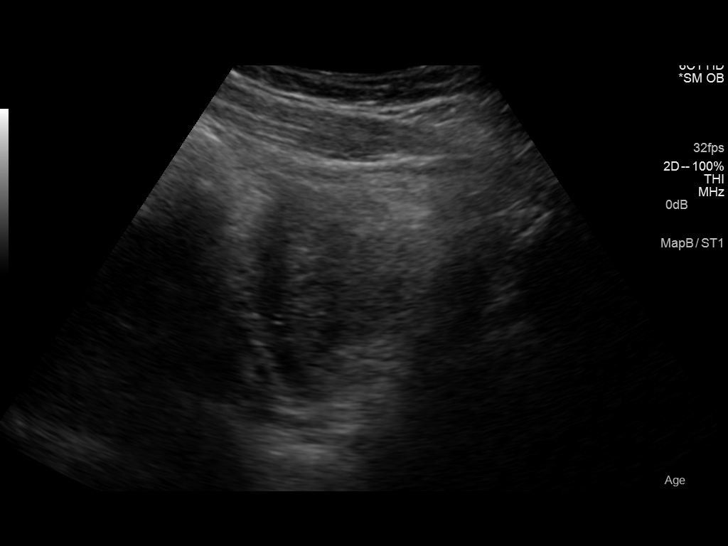
[im 13/37]
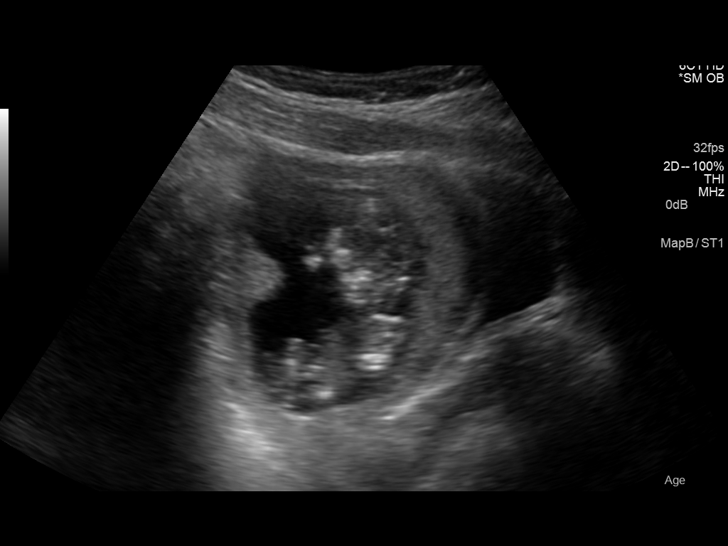
[im 15/37]
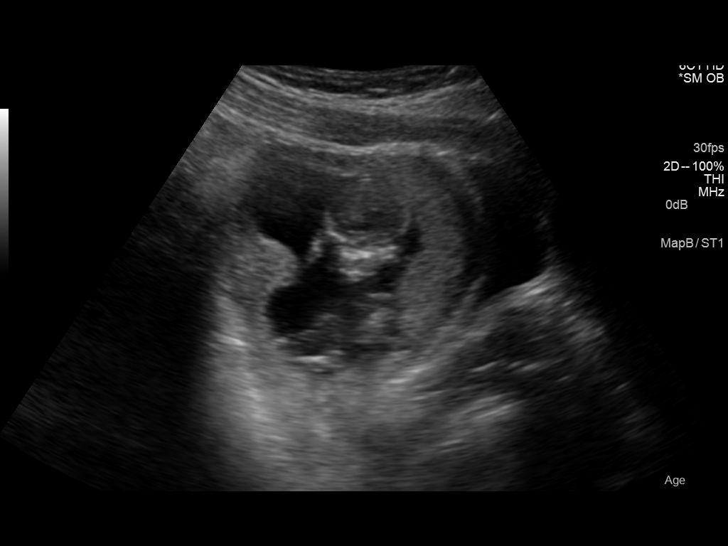
[im 18/37]
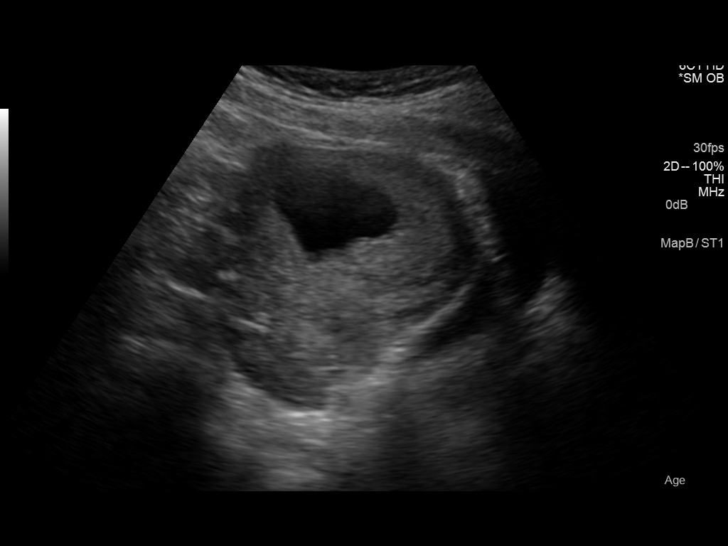
[im 21/37]
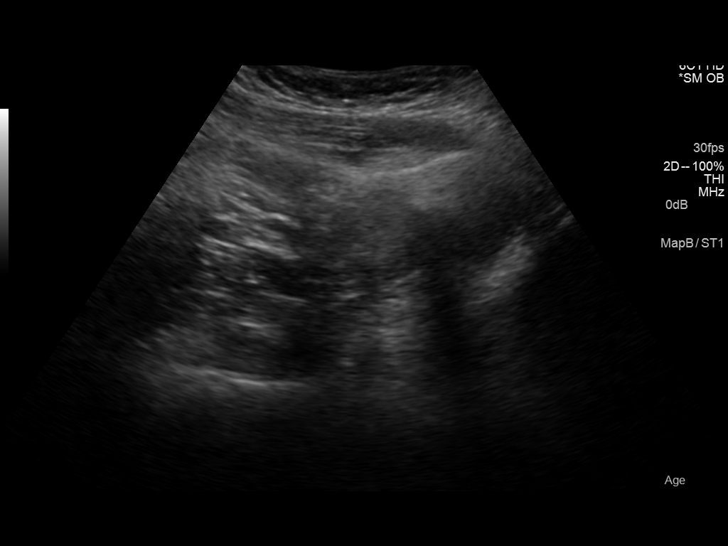
[im 23/37]
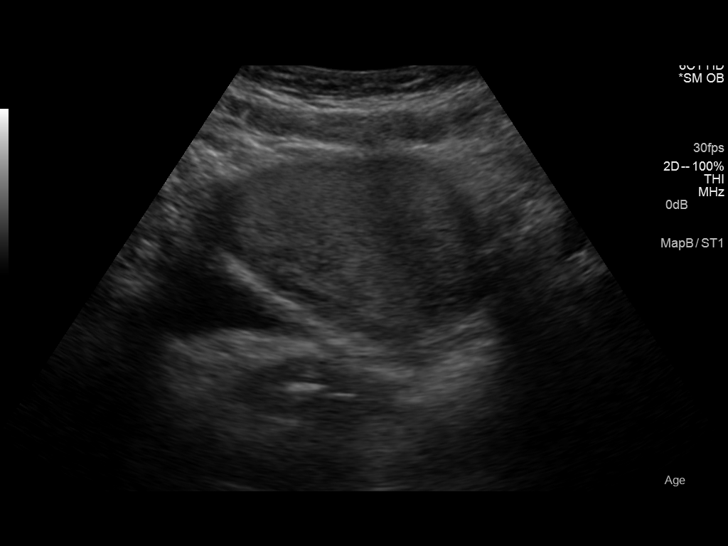
[im 26/37]
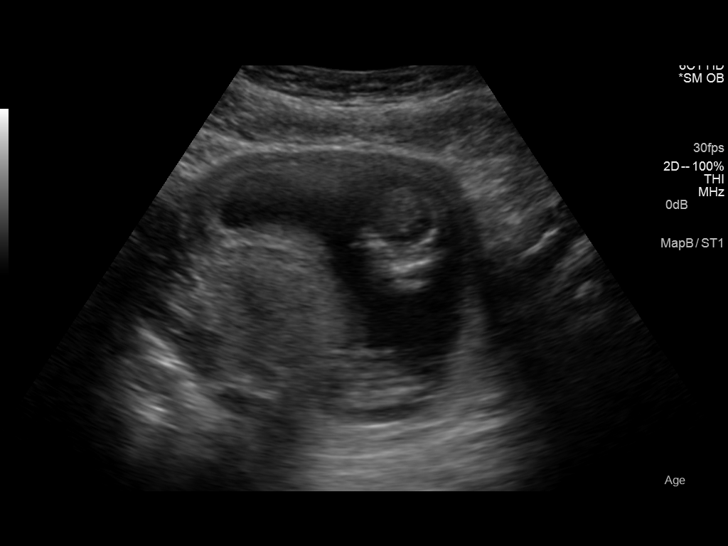
[im 29/37]
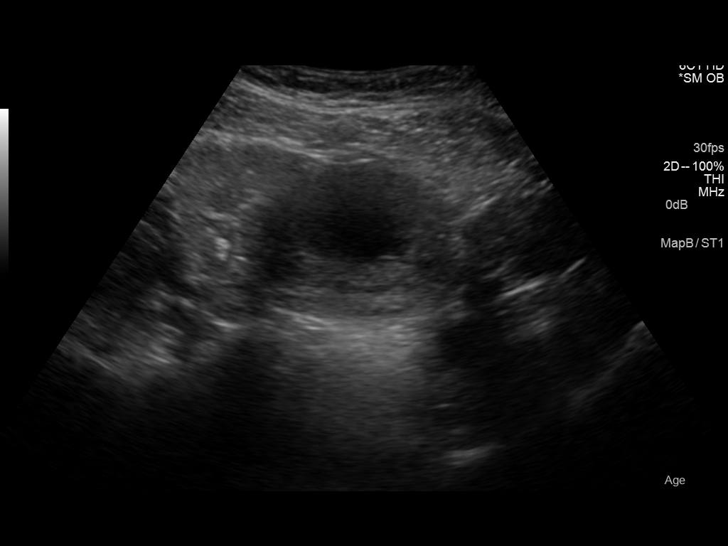
[im 31/37]
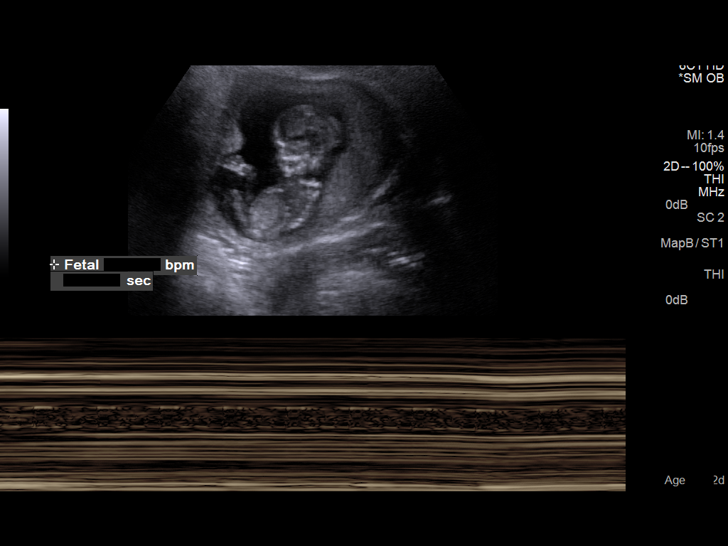
[im 34/37]
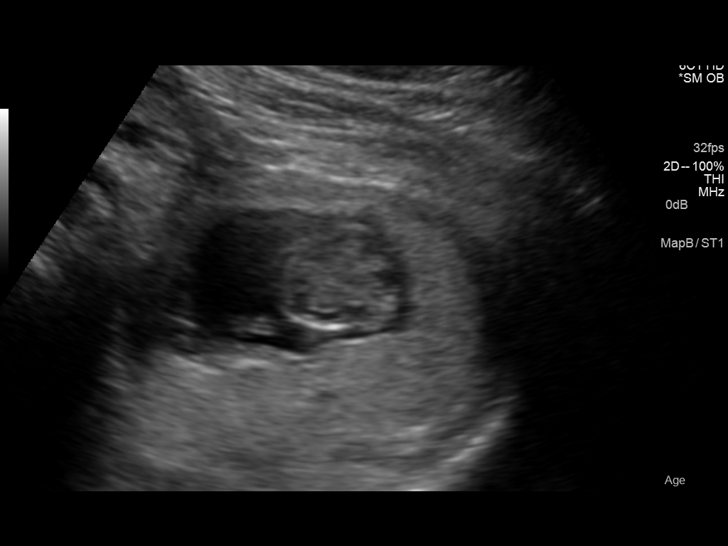
[im 37/37]
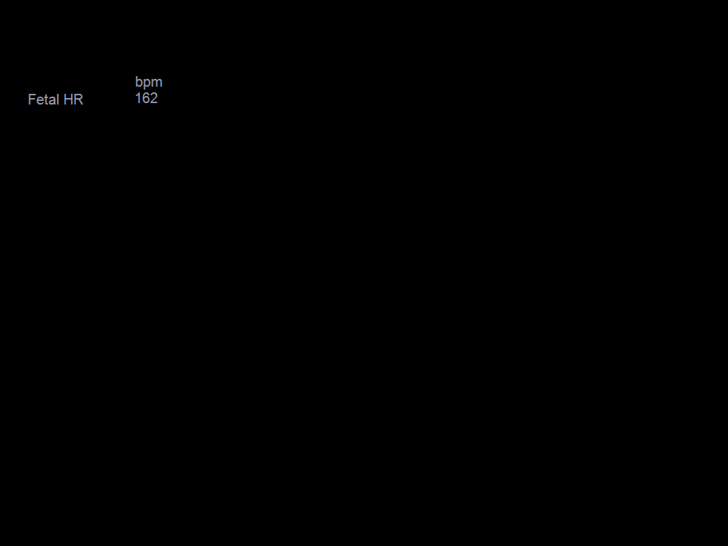

[14 of 28 positions shown; findings below may reference images not displayed]

FINDINGS: Intrauterine gestational sac: Visualized/normal in shape.

Yolk sac:  No longer visualized

Embryo:  Present

Cardiac Activity: Present

Heart Rate: 162 bpm

CRL:   56.7  mm   12 w 2 d                  US EDC: 08/18/2014

Maternal uterus/adnexae: Negative adnexa. No free pelvic fluid. No
subchronic hemorrhage.
IMPRESSION: Single living intrauterine gestation, estimated age 12 weeks 2 days.
No adverse findings.

## 2016-10-05 ENCOUNTER — Ambulatory Visit (INDEPENDENT_AMBULATORY_CARE_PROVIDER_SITE_OTHER): Payer: Medicaid Other | Admitting: Advanced Practice Midwife

## 2016-10-05 ENCOUNTER — Encounter: Payer: Self-pay | Admitting: Advanced Practice Midwife

## 2016-10-05 VITALS — BP 110/70 | HR 71 | Wt 163.0 lb

## 2016-10-05 DIAGNOSIS — O09293 Supervision of pregnancy with other poor reproductive or obstetric history, third trimester: Secondary | ICD-10-CM

## 2016-10-05 DIAGNOSIS — O0990 Supervision of high risk pregnancy, unspecified, unspecified trimester: Secondary | ICD-10-CM | POA: Insufficient documentation

## 2016-10-05 DIAGNOSIS — F17210 Nicotine dependence, cigarettes, uncomplicated: Secondary | ICD-10-CM

## 2016-10-05 DIAGNOSIS — Z3A32 32 weeks gestation of pregnancy: Secondary | ICD-10-CM

## 2016-10-05 DIAGNOSIS — O09299 Supervision of pregnancy with other poor reproductive or obstetric history, unspecified trimester: Secondary | ICD-10-CM

## 2016-10-05 DIAGNOSIS — O0933 Supervision of pregnancy with insufficient antenatal care, third trimester: Secondary | ICD-10-CM

## 2016-10-05 DIAGNOSIS — O99333 Smoking (tobacco) complicating pregnancy, third trimester: Secondary | ICD-10-CM

## 2016-10-05 DIAGNOSIS — Z113 Encounter for screening for infections with a predominantly sexual mode of transmission: Secondary | ICD-10-CM

## 2016-10-05 NOTE — Progress Notes (Signed)
[facility] in [place]. Has not been seen in the past 3 months. Pt c/o feeling pressure in lower abdomen. Pt reports having a boy, had anatomy scan at 20 weeks. Pt concerned about lack of weight gain.

## 2016-10-06 ENCOUNTER — Encounter: Payer: Self-pay | Admitting: Advanced Practice Midwife

## 2016-10-06 DIAGNOSIS — O09299 Supervision of pregnancy with other poor reproductive or obstetric history, unspecified trimester: Secondary | ICD-10-CM | POA: Insufficient documentation

## 2016-10-06 LAB — CBC WITH DIFFERENTIAL/PLATELET
Basophils Absolute: 0 10*3/uL (ref 0.0–0.2)
Basos: 0 %
EOS (ABSOLUTE): 0.3 10*3/uL (ref 0.0–0.4)
EOS: 4 %
HEMATOCRIT: 39.7 % (ref 34.0–46.6)
Hemoglobin: 13.1 g/dL (ref 11.1–15.9)
IMMATURE GRANULOCYTES: 0 %
Immature Grans (Abs): 0 10*3/uL (ref 0.0–0.1)
Lymphocytes Absolute: 3 10*3/uL (ref 0.7–3.1)
Lymphs: 34 %
MCH: 28.8 pg (ref 26.6–33.0)
MCHC: 33 g/dL (ref 31.5–35.7)
MCV: 87 fL (ref 79–97)
MONOCYTES: 7 %
MONOS ABS: 0.7 10*3/uL (ref 0.1–0.9)
Neutrophils Absolute: 4.9 10*3/uL (ref 1.4–7.0)
Neutrophils: 55 %
Platelets: 359 10*3/uL (ref 150–379)
RBC: 4.55 x10E6/uL (ref 3.77–5.28)
RDW: 13.4 % (ref 12.3–15.4)
WBC: 8.9 10*3/uL (ref 3.4–10.8)

## 2016-10-06 LAB — RPR QUALITATIVE: RPR Ser Ql: NONREACTIVE

## 2016-10-06 LAB — HIV ANTIBODY (ROUTINE TESTING W REFLEX): HIV SCREEN 4TH GENERATION: NONREACTIVE

## 2016-10-06 NOTE — Progress Notes (Signed)
New Obstetric Patient H&P    Chief Complaint: "Desires prenatal care"   History of Present Illness: Patient is a 26 y.o. [id number] Not Hispanic or Latino female, LMP uncertain presents as a transfer of care from [facility] in [place], [place]. The patient had late entry to care. Based on her 18 wk u/s, her Estimated Date of Delivery is: 11/27/16 and her EGA is [redacted]w[redacted]d. Cycles are 7. days, regular, and occur approximately every : 28 days. Her last pap smear was 3 months ago and was no abnormalities. She has not had prenatal care in the last three months. The patient declines gestational diabetes screen.     She had a urine pregnancy test which was positive about 14 week(s)  ago. Her last menstrual period was normal and lasted for  7 day(s). Since her LMP she claims she has experienced breast tenderness. She denies vaginal bleeding. Her past medical history is noncontributory. Her urine drug screen at [facility] in May was positive for marijuana. Her prior pregnancies are notable for placental abruption with fetal demise at 28 weeks, IOL for IUGR in 2016 at 37 weeks with [facility].   Since her LMP, she admits to the use of tobacco products  Yes 1/2 cigarette per day She claims she has gained   13 pounds since the start of her pregnancy.  There are cats in the home in the home  no  She admits close contact with children on a regular basis  yes  She has had chicken pox in the past yes She has had Tuberculosis exposures, symptoms, or previously tested positive for TB   no Current or past history of domestic violence. no  Genetic Screening/Teratology Counseling: (Includes patient, baby's father, or anyone in either family with:)   1. Patient's age >/= 50 at [facility]  no 2. Thalassemia ([place], [place], Mediterranean, or Asian background): MCV<80  no 3. Neural tube defect (meningomyelocele, spina bifida, anencephaly)  no 4. Congenital heart defect  no  5. Down syndrome  no 6. Tay-Sachs (Jewish, [place]   no 7. Canavan's Disease  no 8. Sickle cell disease or trait (African)  no  9. Hemophilia or other blood disorders  no  10. Muscular dystrophy  no  11. Cystic fibrosis  no  12. Huntington's Chorea  no  13. Mental retardation/autism  no 14. Other inherited genetic or chromosomal disorder  no 15. Maternal metabolic disorder (DM, PKU, etc)  no 16. Patient or FOB with a child with a birth defect not listed above no  16a. Patient or FOB with a birth defect themselves no 17. Recurrent pregnancy loss, or stillbirth  no  18. Any medications since LMP other than prenatal vitamins (include vitamins, supplements, OTC meds, drugs, alcohol)  no 19. Any other genetic/environmental exposure to discuss  no  Infection History:   1. Lives with someone with TB or TB exposed  no  2. Patient or partner has history of genital herpes  no 3. Rash or viral illness since LMP  no 4. History of STI (GC, CT, HPV, syphilis, HIV)  no 5. History of recent travel :  no  Other pertinent information:  no    Review of Systems:10 point review of systems negative unless otherwise noted in HPI  Past Medical History:  Past Medical History:  Diagnosis Date  . History of IUGR (intrauterine growth retardation) and stillbirth, currently pregnant   . History of stillbirth in currently pregnant patient   . Vaginal Pap smear, abnormal  Past Surgical History:  Past Surgical History:  Procedure Laterality Date  . LEEP      Gynecologic History: Patient's last menstrual period was 02/21/2016 (exact date).  Obstetric History: [id number]  Family History:  Family History  Problem Relation Age of Onset  . Alcohol abuse Neg Hx   . Arthritis Neg Hx   . Asthma Neg Hx   . Birth defects Neg Hx   . Cancer Neg Hx   . COPD Neg Hx   . Depression Neg Hx   . Drug abuse Neg Hx   . Hyperlipidemia Neg Hx   . Varicose Veins Neg Hx   . Vision loss Neg Hx   . Miscarriages / Stillbirths Neg Hx   . Learning disabilities Neg Hx    . Kidney disease Neg Hx   . Hypertension Neg Hx   . Hearing loss Neg Hx   . Diabetes Neg Hx   . Early death Neg Hx   . Heart disease Neg Hx   . Mental illness Neg Hx   . Mental retardation Neg Hx   . Stroke Neg Hx     Social History:  Social History   Social History  . Marital status: Single    Spouse name: N/A  . Number of children: N/A  . Years of education: N/A   Occupational History  . Not on file.   Social History Main Topics  . Smoking status: Former Smoker    Types: Cigarettes  . Smokeless tobacco: Former NeurosurgeonUser  . Alcohol use No  . Drug use: No  . Sexual activity: Yes    Birth control/ protection: Condom   Other Topics Concern  . Not on file   Social History Narrative  . No narrative on file    Allergies:  No Known Allergies  Medications: Prior to Admission medications   Medication Sig Start Date End Date Taking? Authorizing Provider  Prenatal Vit-Fe Fumarate-FA (MULTIVITAMIN-PRENATAL) 27-0.8 MG TABS tablet Take 1 tablet by mouth daily.    Yes [provider]    Physical Exam Vitals: Blood pressure 110/70, pulse 71, weight 163 lb (73.9 kg), last menstrual period 02/21/2016, unknown if currently breastfeeding.  General: NAD HEENT: normocephalic, anicteric Thyroid: no enlargement, no palpable nodules Pulmonary: No increased work of breathing, CTAB Cardiovascular: RRR, distal pulses 2+ Abdomen: NABS, soft, non-tender, non-distended.  Umbilicus without lesions.  No hepatomegaly, splenomegaly or masses palpable. No evidence of hernia  Genitourinary: deferred [facility] Extremities: no edema, erythema, or tenderness Neurologic: Grossly intact Psychiatric: mood appropriate, affect full   Assessment: 26 y.o. [id number] at 6250w4d presenting to initiate prenatal care  Plan: 1) Avoid alcoholic beverages. 2) Patient encouraged not to smoke.  3) Discontinue the use of all non-medicinal drugs and chemicals.  4) Take prenatal  vitamins daily.  5) Nutrition, food safety (fish, cheese advisories, and high nitrite foods) and exercise discussed. 6) Hospital and practice style discussed with cross coverage system.  7) Genetic Screening: the patient has had screening for sickle cell and cystic fibrosis 8) Patient is asked about travel to areas at risk for the Zika virus, and counseled to avoid travel and exposure to mosquitoes or sexual partners who may have themselves been exposed to the virus. Testing is discussed, and will be ordered as appropriate.  9) 28 week labs minus glucola done today.   Kimberly MallJane Daaiel Shea, CNM

## 2016-10-19 ENCOUNTER — Encounter: Payer: Medicaid Other | Admitting: Obstetrics & Gynecology

## 2016-10-21 ENCOUNTER — Encounter: Payer: Medicaid Other | Admitting: Advanced Practice Midwife

## 2016-11-02 ENCOUNTER — Ambulatory Visit (INDEPENDENT_AMBULATORY_CARE_PROVIDER_SITE_OTHER): Payer: Medicaid Other | Admitting: Advanced Practice Midwife

## 2016-11-02 ENCOUNTER — Encounter: Payer: Self-pay | Admitting: Advanced Practice Midwife

## 2016-11-02 VITALS — BP 110/70 | Wt 162.0 lb

## 2016-11-02 DIAGNOSIS — Z113 Encounter for screening for infections with a predominantly sexual mode of transmission: Secondary | ICD-10-CM

## 2016-11-02 DIAGNOSIS — Z3685 Encounter for antenatal screening for Streptococcus B: Secondary | ICD-10-CM

## 2016-11-02 DIAGNOSIS — O0990 Supervision of high risk pregnancy, unspecified, unspecified trimester: Secondary | ICD-10-CM

## 2016-11-02 DIAGNOSIS — Z3A36 36 weeks gestation of pregnancy: Secondary | ICD-10-CM

## 2016-11-02 NOTE — Progress Notes (Signed)
Patient seen today

## 2016-11-02 NOTE — Progress Notes (Signed)
Good fetal movement. Denies Ctx's, LOF, VB. GBS/aptima today. Cervix is visually closed. Patient wants to sign consent for BTL. She states she is certain that she does not want more children. She will see Jola BabinskiMarilyn today.

## 2016-11-06 LAB — GC/CHLAMYDIA PROBE AMP
Chlamydia trachomatis, NAA: NEGATIVE
Neisseria gonorrhoeae by PCR: NEGATIVE

## 2016-11-06 LAB — STREP GP B NAA

## 2016-11-06 LAB — URINE DRUG PANEL 7
Amphetamines, Urine: NEGATIVE ng/mL
BENZODIAZEPINE QUANT UR: NEGATIVE ng/mL
Barbiturate Quant, Ur: NEGATIVE ng/mL
COCAINE (METAB.): POSITIVE — AB
Cannabinoid Quant, Ur: POSITIVE — AB
OPIATE QUANT UR: NEGATIVE ng/mL
PCP Quant, Ur: NEGATIVE ng/mL

## 2016-11-09 IMAGING — US US FETAL BPP W/O NONSTRESS
1 series · 14 of 23 positions shown · non-contrast
Comparison: none

[Series 1: us fetal bpp w/o nonstress · 0.28mm/px · 14 of 23 slices shown]
[im 1/23]
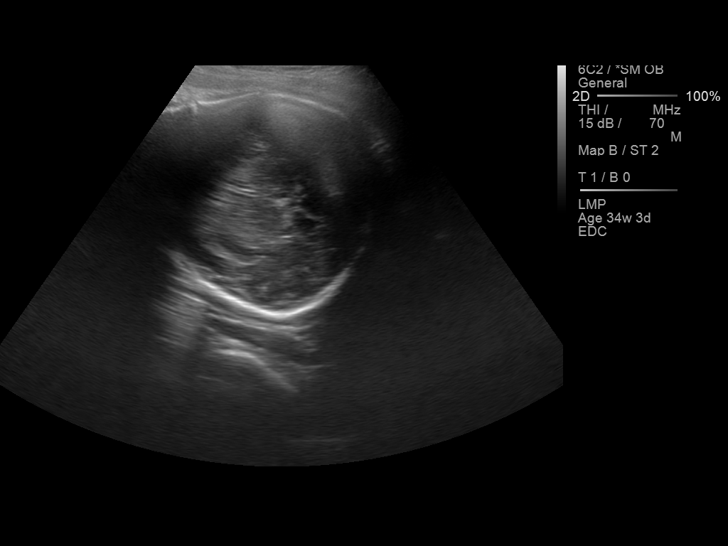
[im 3/23]
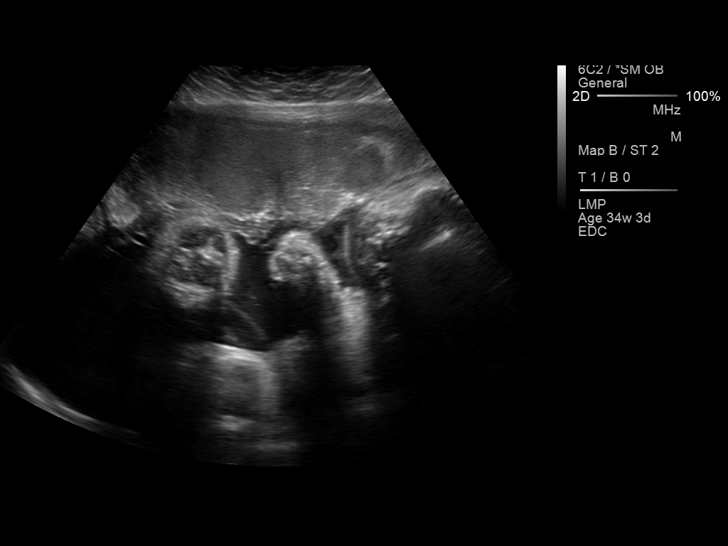
[im 5/23]
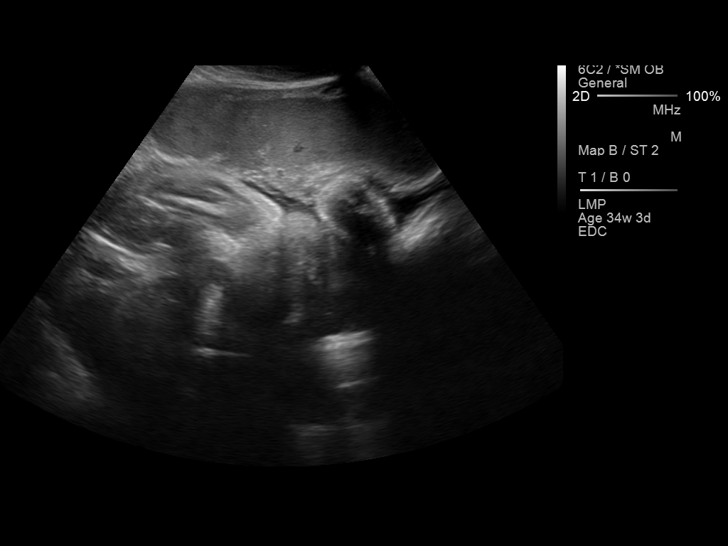
[im 6/23]
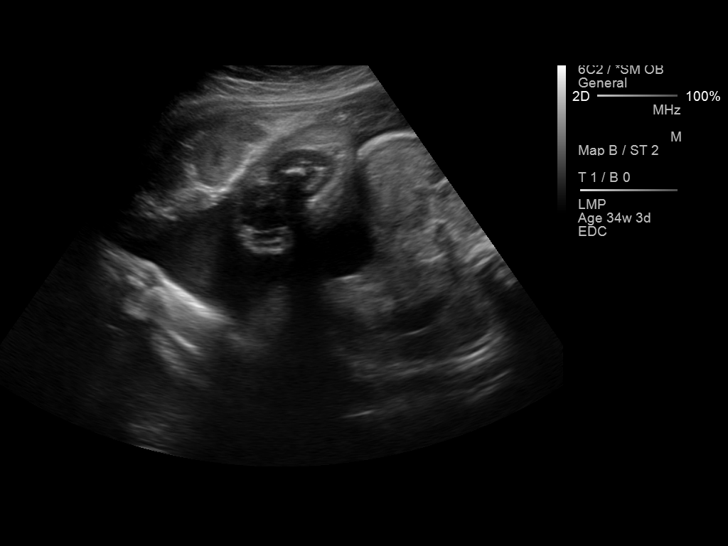
[im 8/23]
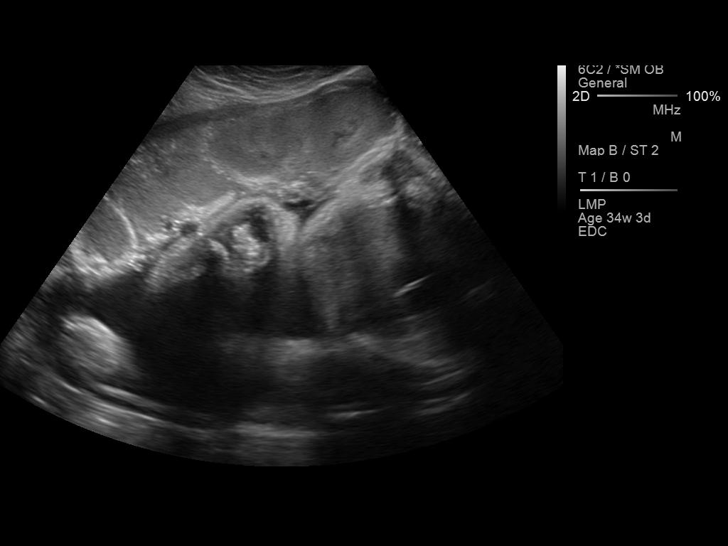
[im 10/23]
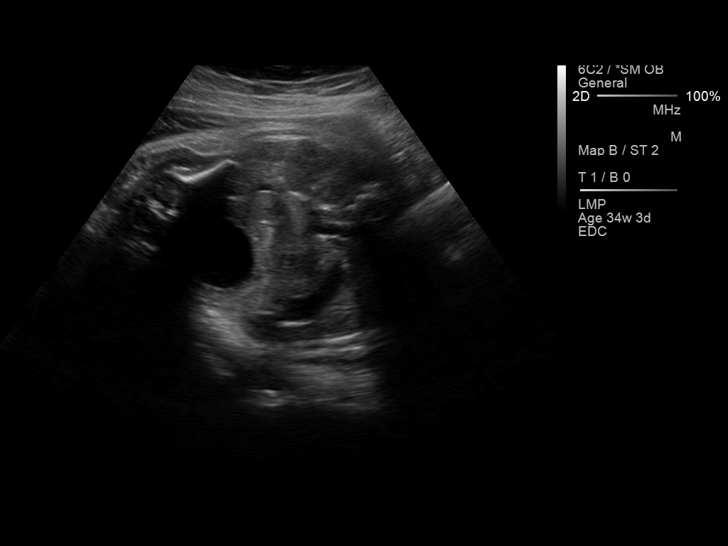
[im 11/23]
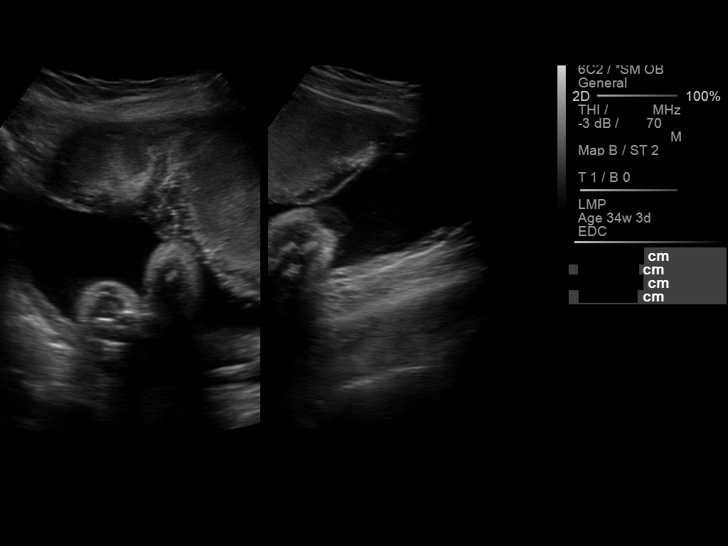
[im 13/23]
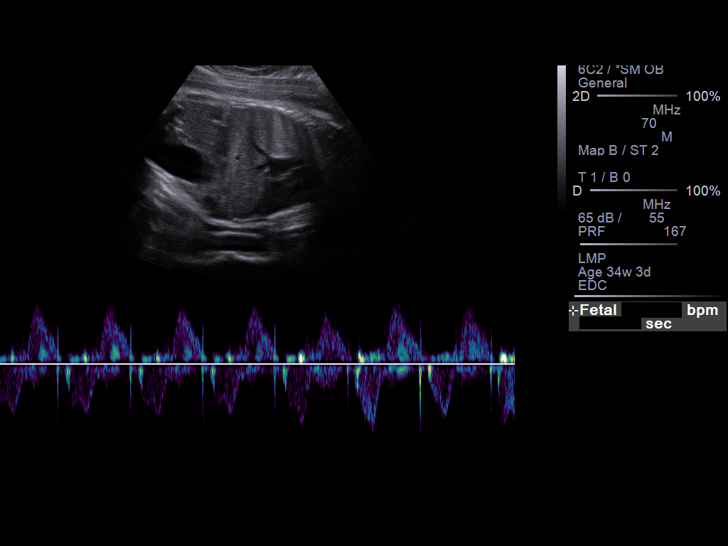
[im 14/23]
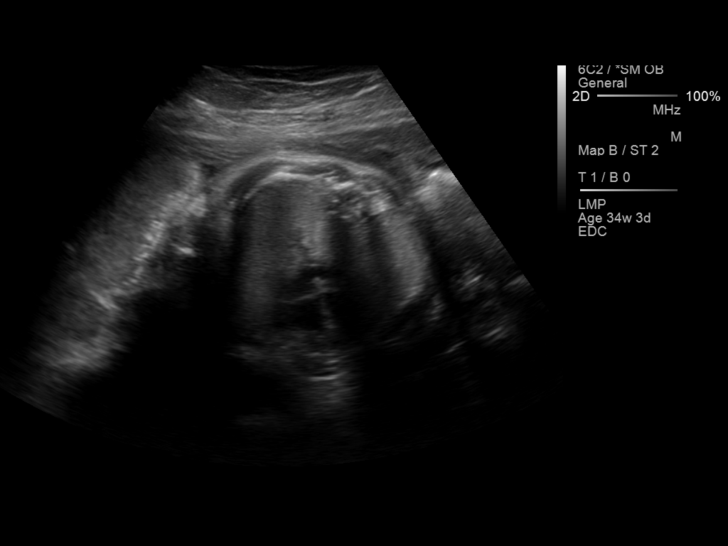
[im 16/23]
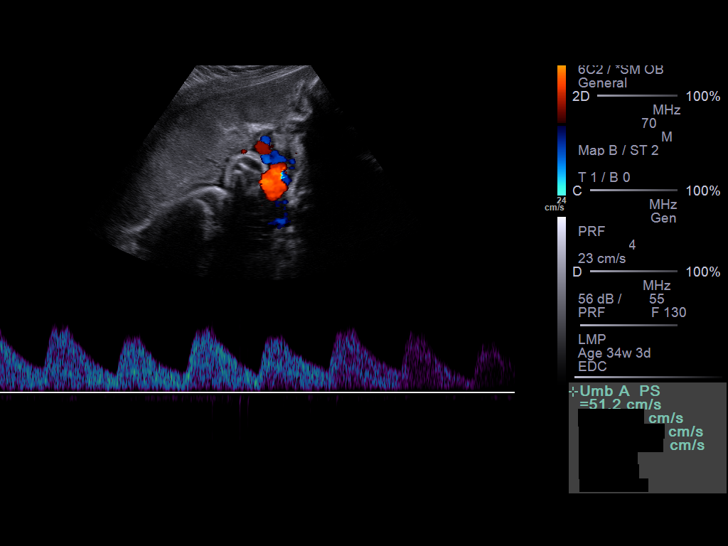
[im 18/23]
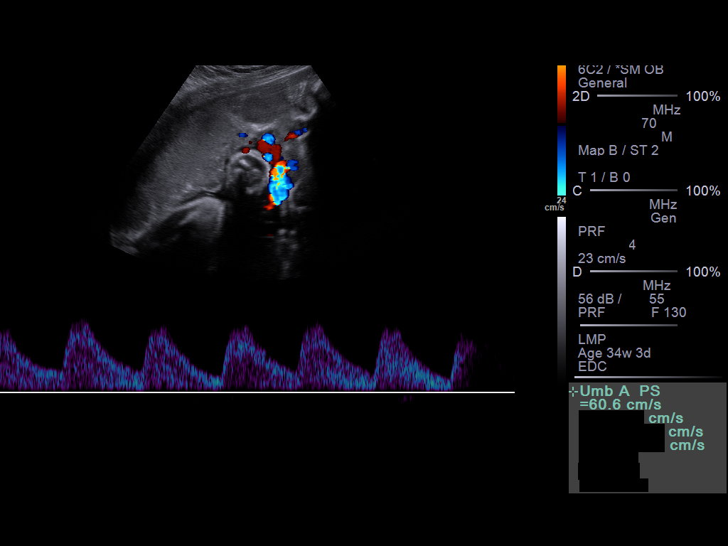
[im 19/23]
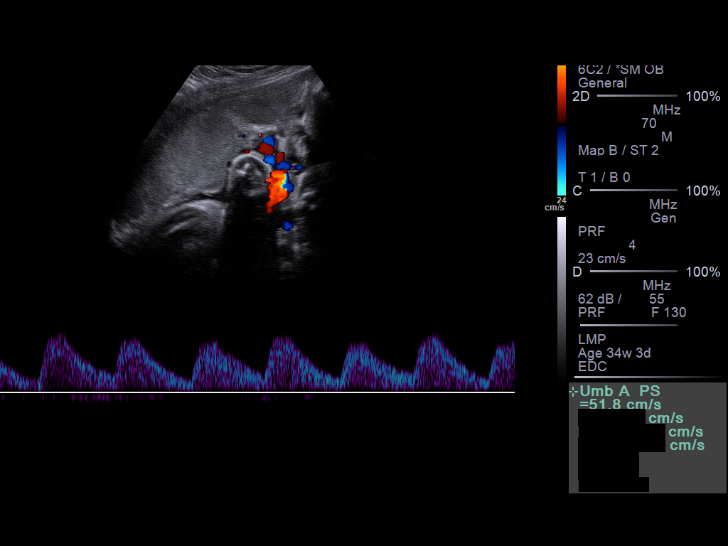
[im 21/23]
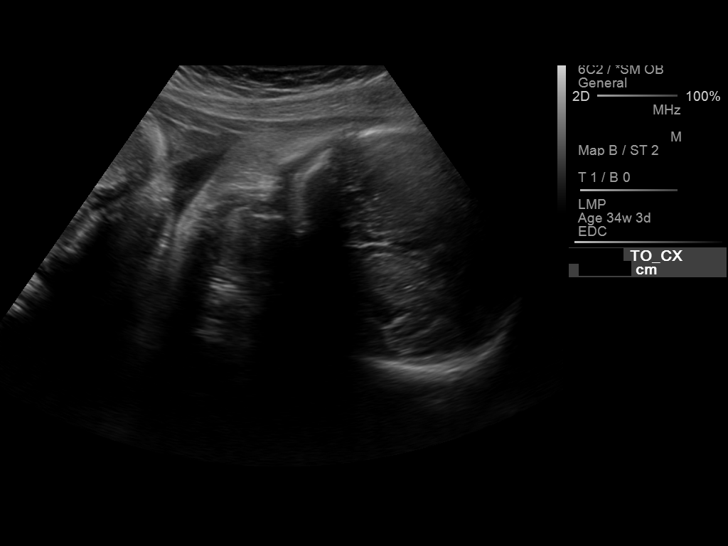
[im 23/23]
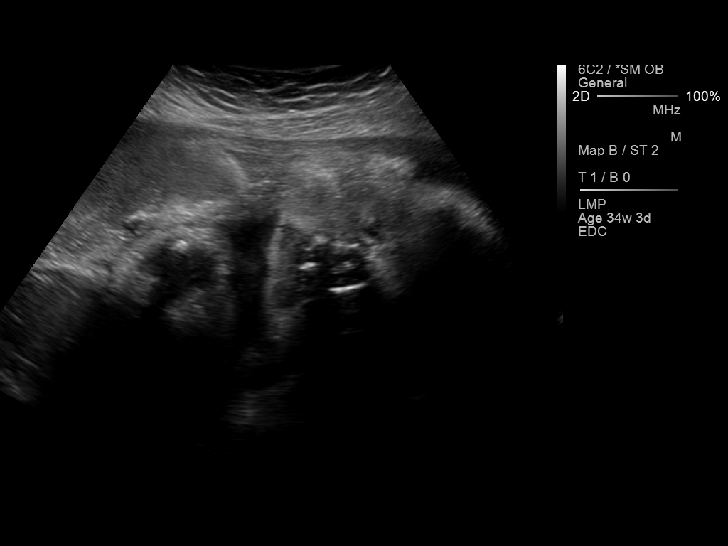

[14 of 23 positions shown; findings below may reference images not displayed]

Canned report from images found in remote index.

Refer to host system for actual result text.

## 2016-11-10 ENCOUNTER — Ambulatory Visit (INDEPENDENT_AMBULATORY_CARE_PROVIDER_SITE_OTHER): Payer: Medicaid Other | Admitting: Advanced Practice Midwife

## 2016-11-10 VITALS — BP 108/72 | Wt 167.0 lb

## 2016-11-10 DIAGNOSIS — Z3A37 37 weeks gestation of pregnancy: Secondary | ICD-10-CM

## 2016-11-10 DIAGNOSIS — Z3685 Encounter for antenatal screening for Streptococcus B: Secondary | ICD-10-CM

## 2016-11-10 NOTE — Progress Notes (Signed)
Patient has occasional contractions. She denies LOF or VB. Good fetal movement. GBS specimen not resulted due to expired collection tube. Re-collected today. Labor precautions reviewed.

## 2016-11-10 NOTE — Patient Instructions (Signed)
Vaginal Delivery Vaginal delivery means that you will give birth by pushing your baby out of your birth canal (vagina). A team of health care providers will help you before, during, and after vaginal delivery. Birth experiences are unique for every woman and every pregnancy, and birth experiences vary depending on where you choose to give birth. What should I do to prepare for my baby's birth? Before your baby is born, it is important to talk with your health care provider about:  Your labor and delivery preferences. These may include: ? Medicines that you may be given. ? How you will manage your pain. This might include non-medical pain relief techniques or injectable pain relief such as epidural analgesia. ? How you and your baby will be monitored during labor and delivery. ? Who may be in the labor and delivery room with you. ? Your feelings about surgical delivery of your baby (cesarean delivery, or C-section) if this becomes necessary. ? Your feelings about receiving donated blood through an IV tube (blood transfusion) if this becomes necessary.  Whether you are able: ? To take pictures or videos of the birth. ? To eat during labor and delivery. ? To move around, walk, or change positions during labor and delivery.  What to expect after your baby is born, such as: ? Whether delayed umbilical cord clamping and cutting is offered. ? Who will care for your baby right after birth. ? Medicines or tests that may be recommended for your baby. ? Whether breastfeeding is supported in your hospital or birth center. ? How long you will be in the hospital or birth center.  How any medical conditions you have may affect your baby or your labor and delivery experience.  To prepare for your baby's birth, you should also:  Attend all of your health care visits before delivery (prenatal visits) as recommended by your health care provider. This is important.  Prepare your home for your baby's  arrival. Make sure that you have: ? Diapers. ? Baby clothing. ? Feeding equipment. ? Safe sleeping arrangements for you and your baby.  Install a car seat in your vehicle. Have your car seat checked by a certified car seat installer to make sure that it is installed safely.  Think about who will help you with your new baby at home for at least the first several weeks after delivery.  What can I expect when I arrive at the birth center or hospital? Once you are in labor and have been admitted into the hospital or birth center, your health care provider may:  Review your pregnancy history and any concerns you have.  Insert an IV tube into one of your veins. This is used to give you fluids and medicines.  Check your blood pressure, pulse, temperature, and heart rate (vital signs).  Check whether your bag of water (amniotic sac) has broken (ruptured).  Talk with you about your birth plan and discuss pain control options.  Monitoring Your health care provider may monitor your contractions (uterine monitoring) and your baby's heart rate (fetal monitoring). You may need to be monitored:  Often, but not continuously (intermittently).  All the time or for long periods at a time (continuously). Continuous monitoring may be needed if: ? You are taking certain medicines, such as medicine to relieve pain or make your contractions stronger. ? You have pregnancy or labor complications.  Monitoring may be done by:  Placing a special stethoscope or a handheld monitoring device on your abdomen to   check your baby's heartbeat, and feeling your abdomen for contractions. This method of monitoring does not continuously record your baby's heartbeat or your contractions.  Placing monitors on your abdomen (external monitors) to record your baby's heartbeat and the frequency and length of contractions. You may not have to wear external monitors all the time.  Placing monitors inside of your uterus  (internal monitors) to record your baby's heartbeat and the frequency, length, and strength of your contractions. ? Your health care provider may use internal monitors if he or she needs more information about the strength of your contractions or your baby's heart rate. ? Internal monitors are put in place by passing a thin, flexible wire through your vagina and into your uterus. Depending on the type of monitor, it may remain in your uterus or on your baby's head until birth. ? Your health care provider will discuss the benefits and risks of internal monitoring with you and will ask for your permission before inserting the monitors.  Telemetry. This is a type of continuous monitoring that can be done with external or internal monitors. Instead of having to stay in bed, you are able to move around during telemetry. Ask your health care provider if telemetry is an option for you.  Physical exam Your health care provider may perform a physical exam. This may include:  Checking whether your baby is positioned: ? With the head toward your vagina (head-down). This is most common. ? With the head toward the top of your uterus (head-up or breech). If your baby is in a breech position, your health care provider may try to turn your baby to a head-down position so you can deliver vaginally. If it does not seem that your baby can be born vaginally, your provider may recommend surgery to deliver your baby. In rare cases, you may be able to deliver vaginally if your baby is head-up (breech delivery). ? Lying sideways (transverse). Babies that are lying sideways cannot be delivered vaginally.  Checking your cervix to determine: ? Whether it is thinning out (effacing). ? Whether it is opening up (dilating). ? How low your baby has moved into your birth canal.  What are the three stages of labor and delivery?  Normal labor and delivery is divided into the following three stages: Stage 1  Stage 1 is the  longest stage of labor, and it can last for hours or days. Stage 1 includes: ? Early labor. This is when contractions may be irregular, or regular and mild. Generally, early labor contractions are more than 10 minutes apart. ? Active labor. This is when contractions get longer, more regular, more frequent, and more intense. ? The transition phase. This is when contractions happen very close together, are very intense, and may last longer than during any other part of labor.  Contractions generally feel mild, infrequent, and irregular at first. They get stronger, more frequent (about every 2-3 minutes), and more regular as you progress from early labor through active labor and transition.  Many women progress through stage 1 naturally, but you may need help to continue making progress. If this happens, your health care provider may talk with you about: ? Rupturing your amniotic sac if it has not ruptured yet. ? Giving you medicine to help make your contractions stronger and more frequent.  Stage 1 ends when your cervix is completely dilated to 4 inches (10 cm) and completely effaced. This happens at the end of the transition phase. Stage 2  Once   your cervix is completely effaced and dilated to 4 inches (10 cm), you may start to feel an urge to push. It is common for the body to naturally take a rest before feeling the urge to push, especially if you received an epidural or certain other pain medicines. This rest period may last for up to 1-2 hours, depending on your unique labor experience.  During stage 2, contractions are generally less painful, because pushing helps relieve contraction pain. Instead of contraction pain, you may feel stretching and burning pain, especially when the widest part of your baby's head passes through the vaginal opening (crowning).  Your health care provider will closely monitor your pushing progress and your baby's progress through the vagina during stage 2.  Your  health care provider may massage the area of skin between your vaginal opening and anus (perineum) or apply warm compresses to your perineum. This helps it stretch as the baby's head starts to crown, which can help prevent perineal tearing. ? In some cases, an incision may be made in your perineum (episiotomy) to allow the baby to pass through the vaginal opening. An episiotomy helps to make the opening of the vagina larger to allow more room for the baby to fit through.  It is very important to breathe and focus so your health care provider can control the delivery of your baby's head. Your health care provider may have you decrease the intensity of your pushing, to help prevent perineal tearing.  After delivery of your baby's head, the shoulders and the rest of the body generally deliver very quickly and without difficulty.  Once your baby is delivered, the umbilical cord may be cut right away, or this may be delayed for 1-2 minutes, depending on your baby's health. This may vary among health care providers, hospitals, and birth centers.  If you and your baby are healthy enough, your baby may be placed on your chest or abdomen to help maintain the baby's temperature and to help you bond with each other. Some mothers and babies start breastfeeding at this time. Your health care team will dry your baby and help keep your baby warm during this time.  Your baby may need immediate care if he or she: ? Showed signs of distress during labor. ? Has a medical condition. ? Was born too early (prematurely). ? Had a bowel movement before birth (meconium). ? Shows signs of difficulty transitioning from being inside the uterus to being outside of the uterus. If you are planning to breastfeed, your health care team will help you begin a feeding. Stage 3  The third stage of labor starts immediately after the birth of your baby and ends after you deliver the placenta. The placenta is an organ that develops  during pregnancy to provide oxygen and nutrients to your baby in the womb.  Delivering the placenta may require some pushing, and you may have mild contractions. Breastfeeding can stimulate contractions to help you deliver the placenta.  After the placenta is delivered, your uterus should tighten (contract) and become firm. This helps to stop bleeding in your uterus. To help your uterus contract and to control bleeding, your health care provider may: ? Give you medicine by injection, through an IV tube, by mouth, or through your rectum (rectally). ? Massage your abdomen or perform a vaginal exam to remove any blood clots that are left in your uterus. ? Empty your bladder by placing a thin, flexible tube (catheter) into your bladder. ? Encourage   you to breastfeed your baby. After labor is over, you and your baby will be monitored closely to ensure that you are both healthy until you are ready to go home. Your health care team will teach you how to care for yourself and your baby. This information is not intended to replace advice given to you by your health care provider. Make sure you discuss any questions you have with your health care provider. Document Released: 11/17/2007 Document Revised: 08/28/2015 Document Reviewed: 02/22/2015 Elsevier Interactive Patient Education  2018 [employer]  

## 2016-11-10 NOTE — Progress Notes (Signed)
ROB

## 2016-11-12 LAB — STREP GP B NAA: STREP GROUP B AG: NEGATIVE

## 2016-11-16 IMAGING — US US UA CORD DOPPLER
1 series · 14 of 25 positions shown · non-contrast
Comparison: none

[Series 1: us ua cord doppler · 0.26mm/px · 25 acquisitions, 14 frames shown]
[im 1/25]
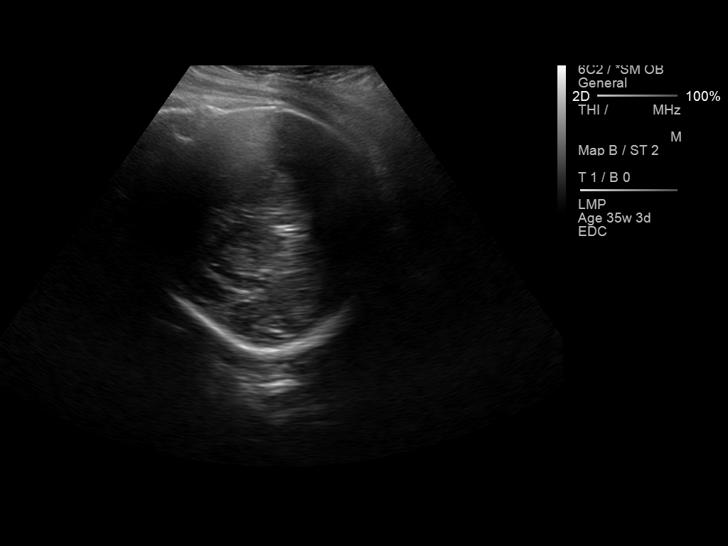
[im 3/25]
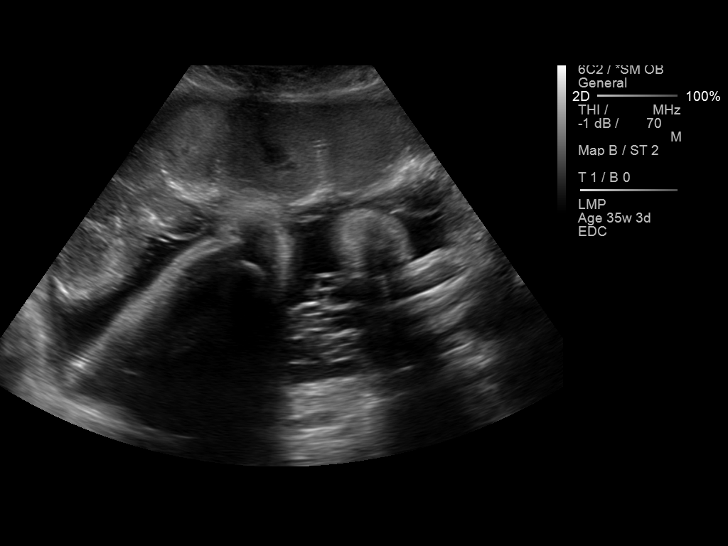
[im 5/25]
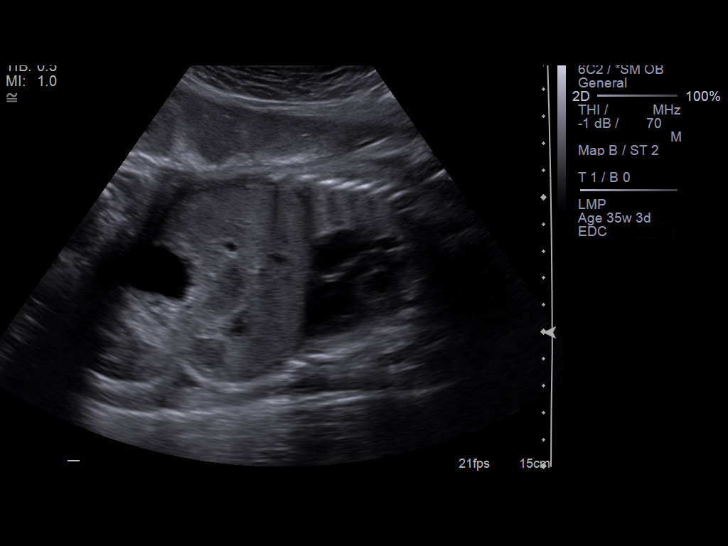
[im 7/25]
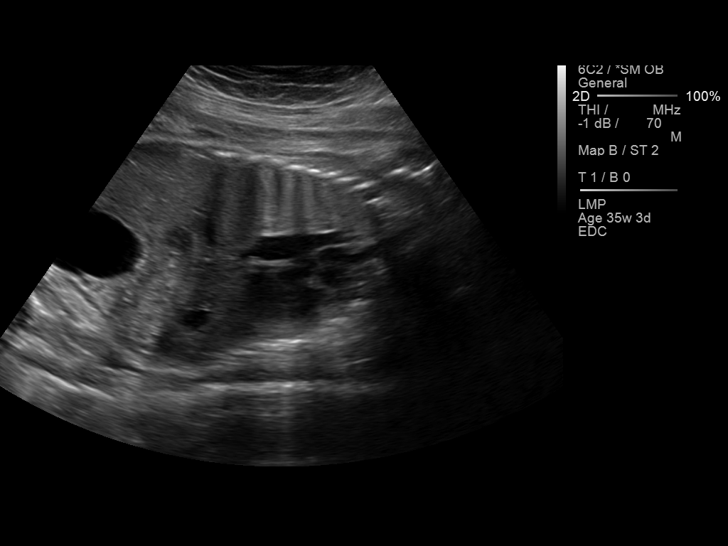
[im 9/25]
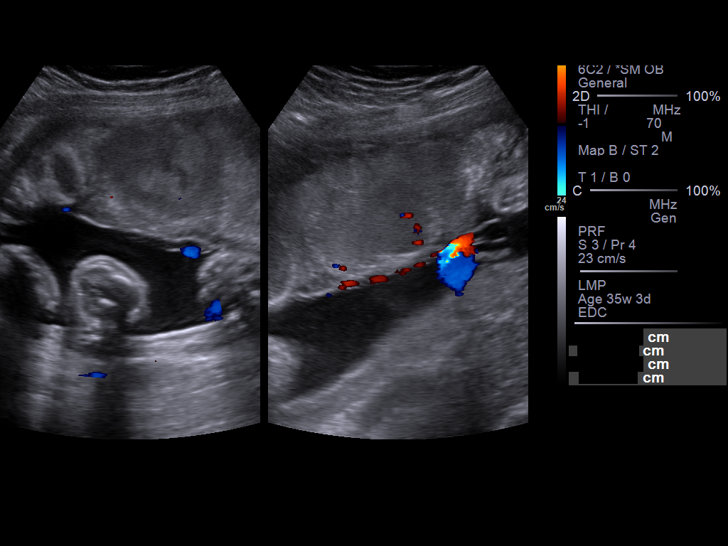
[im 10/25]
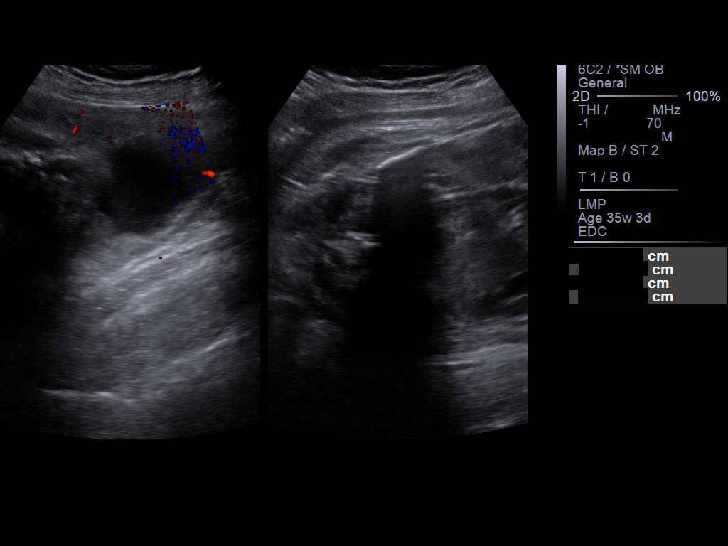
[im 12/25]
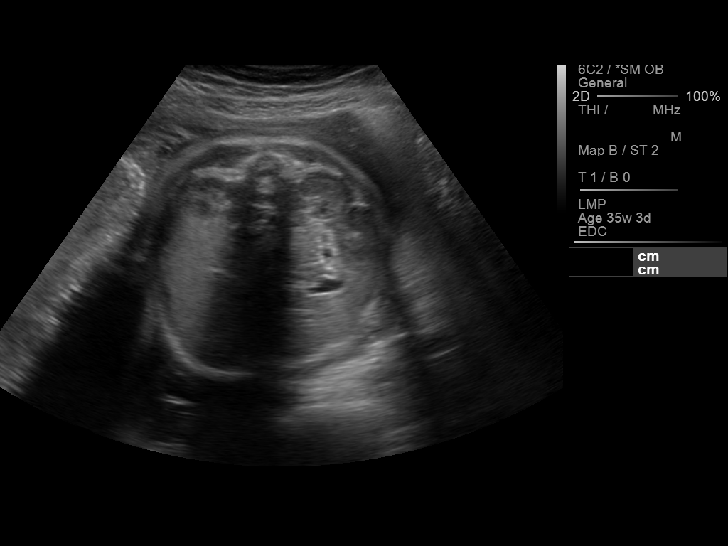
[im 14/25]
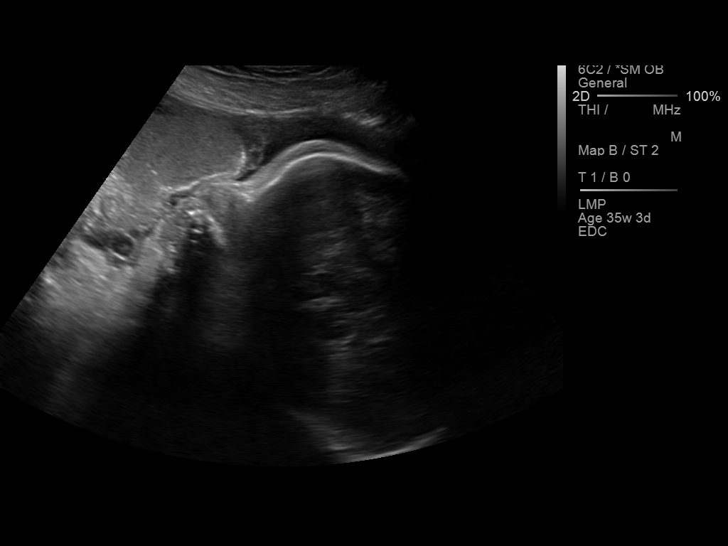
[im 16/25]
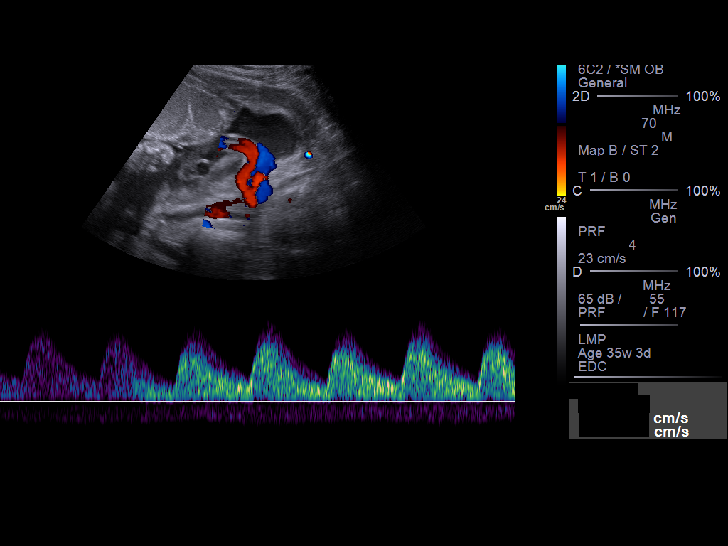
[im 17/25]
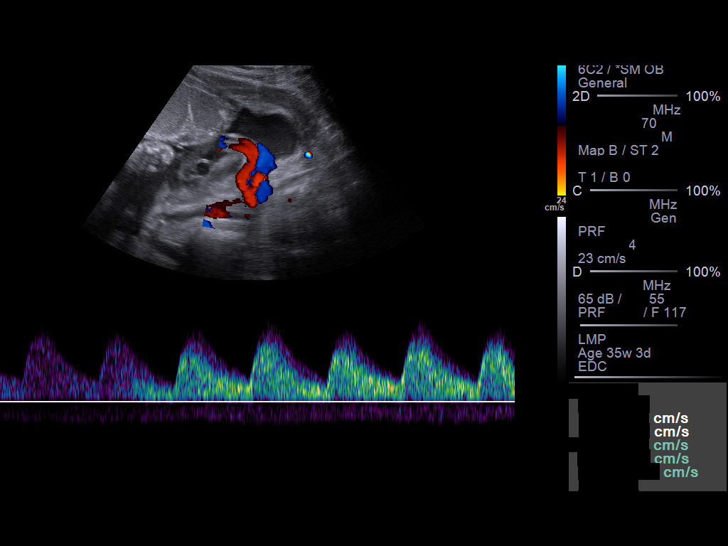
[im 19/25]
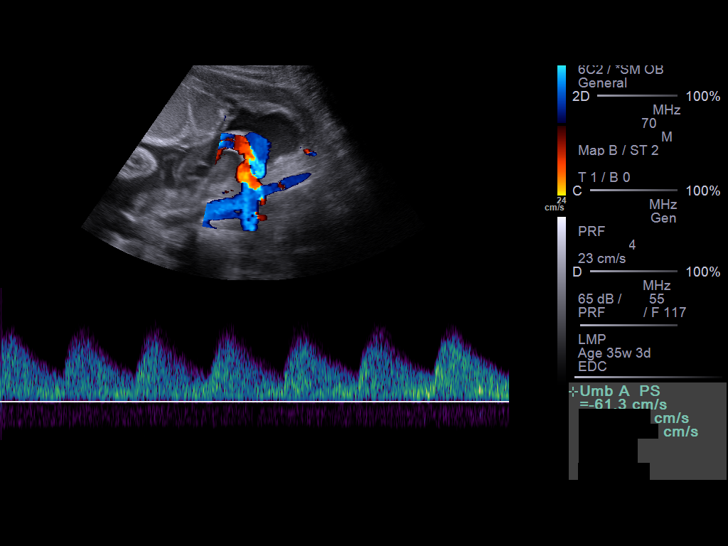
[im 21/25]
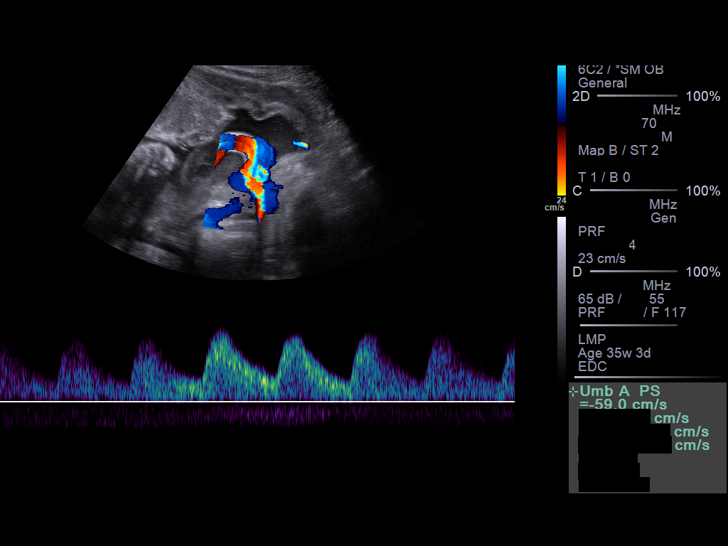
[im 23/25]
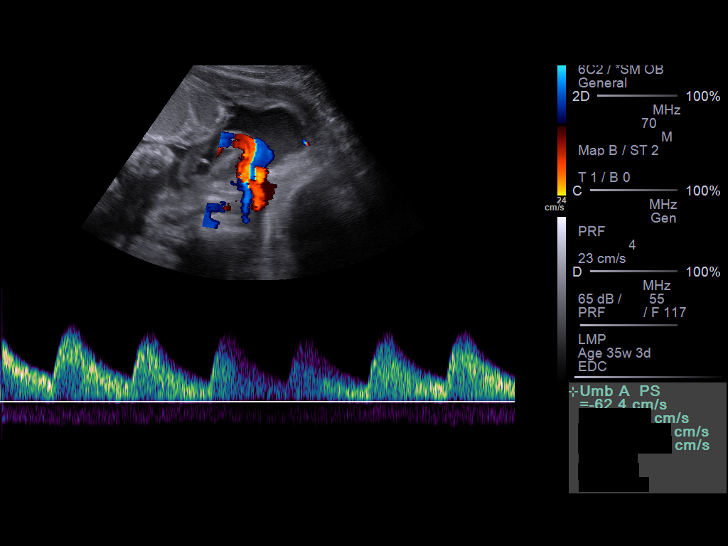
[im 25/25]
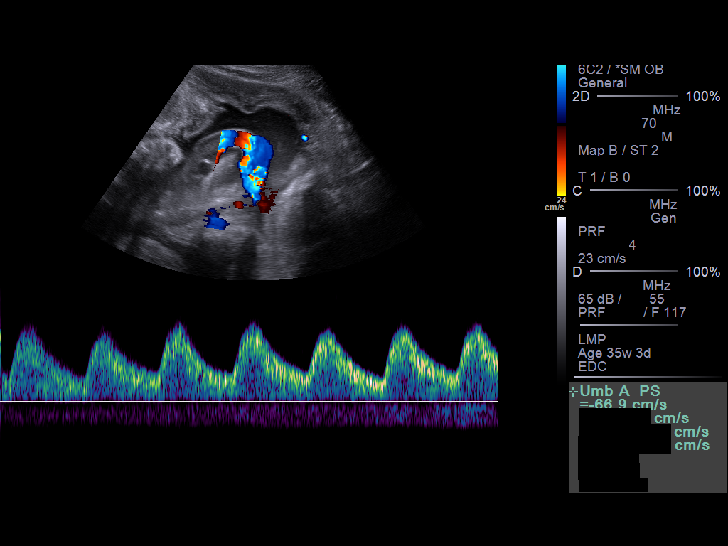

[14 of 25 positions shown; findings below may reference images not displayed]

Canned report from images found in remote index.

Refer to host system for actual result text.

## 2016-11-17 ENCOUNTER — Ambulatory Visit (INDEPENDENT_AMBULATORY_CARE_PROVIDER_SITE_OTHER): Payer: Medicaid Other | Admitting: Maternal Newborn

## 2016-11-17 VITALS — BP 120/80 | Wt 169.0 lb

## 2016-11-17 DIAGNOSIS — Z3A38 38 weeks gestation of pregnancy: Secondary | ICD-10-CM

## 2016-11-17 DIAGNOSIS — O0990 Supervision of high risk pregnancy, unspecified, unspecified trimester: Secondary | ICD-10-CM

## 2016-11-17 DIAGNOSIS — O09299 Supervision of pregnancy with other poor reproductive or obstetric history, unspecified trimester: Secondary | ICD-10-CM

## 2016-11-17 NOTE — Progress Notes (Signed)
    Routine Prenatal Care Visit  Subjective  Kimberly Shea is a 26 y.o. [id number] at [redacted]w[redacted]d being seen today for ongoing prenatal care.  She is currently monitored for the following issues for this high-risk pregnancy and has Pregnancy, supervision, high-risk, unspecified trimester; Tobacco use; and History of intrauterine fetal death, currently pregnant on her problem list.  ----------------------------------------------------------------------------------- Patient reports contractions every 3-24min lasting 45 secs, which space out with rest. Contractions: Irregular. Vag. Bleeding: None.  Movement: Present. Denies leaking of fluid.  ----------------------------------------------------------------------------------- The following portions of the patient's history were reviewed and updated as appropriate: allergies, current medications, past family history, past medical history, past social history, past surgical history and problem list. Problem list updated.   Objective  Blood pressure 120/80, weight 169 lb (76.7 kg), last menstrual period 02/21/2016. Pregravid weight 150 lb (68 kg) Total Weight Gain 19 lb (8.618 kg) Urinalysis: Urine Protein: Trace Urine Glucose: Negative  Fetal Status: Fetal Heart Rate (bpm): 120 Fundal Height: 37 cm Movement: Present     General:  Alert, oriented and cooperative. Patient is in no acute distress.  Skin: Skin is warm and dry. No rash noted.   Cardiovascular: Normal heart rate noted  Respiratory: Normal respiratory effort, no problems with respiration noted  Abdomen: Soft, gravid, appropriate for gestational age. Pain/Pressure: Present     Pelvic:  Cervical exam performed Dilation: Closed Effacement (%): 30 Station: Ballotable  Extremities: Normal range of motion.  Edema: None  Mental Status: Normal mood and affect. Normal behavior. Normal judgment and thought content.     Assessment   26 y.o. [id number] at [redacted]w[redacted]d by  11/27/2016, by Last Menstrual Period  presenting for routine prenatal visit.  Plan   pregnancy #5 Problems (from 10/05/16 to present)    Problem Noted Resolved   History of intrauterine fetal death, currently pregnant 10-30-16 by Tresea Mall, CNM No    Discussed labor contractions vs. prodromal labor and encouraged patient to rest. Advised her go to L&D if her contractions become more painful/frequent.  Term labor symptoms and general obstetric precautions including but not limited to vaginal bleeding, contractions, leaking of fluid and fetal movement were reviewed in detail with the patient.  Return in about 1 week (around 11/24/2016) for ROB.  Marcelyn Bruins, CNM 11/17/2016  3:36 PM

## 2016-11-24 ENCOUNTER — Ambulatory Visit (INDEPENDENT_AMBULATORY_CARE_PROVIDER_SITE_OTHER): Payer: Medicaid Other | Admitting: Maternal Newborn

## 2016-11-24 VITALS — BP 108/78 | Wt 168.0 lb

## 2016-11-24 DIAGNOSIS — Z3A39 39 weeks gestation of pregnancy: Secondary | ICD-10-CM

## 2016-11-24 DIAGNOSIS — O0990 Supervision of high risk pregnancy, unspecified, unspecified trimester: Secondary | ICD-10-CM

## 2016-11-24 NOTE — Patient Instructions (Signed)

## 2016-11-24 NOTE — Progress Notes (Signed)
    Routine Prenatal Care Visit  Subjective  Kimberly Shea is a 26 y.o. [id number] at [redacted]w[redacted]d being seen today for ongoing prenatal care.  She is currently monitored for the following issues for this high-risk pregnancy and has Pregnancy, supervision, high-risk, unspecified trimester; Tobacco use; and History of intrauterine fetal death, currently pregnant on her problem list.  ----------------------------------------------------------------------------------- Patient reports backache and fatigue.  Contractions have spaced out since last visit. Contractions: Irregular. Vag. Bleeding: None.  Movement: Present. Denies leaking of fluid.  ----------------------------------------------------------------------------------- The following portions of the patient's history were reviewed and updated as appropriate: allergies, current medications, past family history, past medical history, past social history, past surgical history and problem list. Problem list updated.   Objective  Blood pressure 108/78, weight 168 lb (76.2 kg), last menstrual period 02/21/2016, unknown if currently breastfeeding. Pregravid weight 150 lb (68 kg) Total Weight Gain 18 lb (8.165 kg) Urinalysis: no sample obtained      Fetal Status: Fetal Heart Rate (bpm): 132 Fundal Height: 36 cm Movement: Present     General:  Alert, oriented and cooperative. Patient is in no acute distress.  Skin: Skin is warm and dry. No rash noted.   Cardiovascular: Normal heart rate noted  Respiratory: Normal respiratory effort, no problems with respiration noted  Abdomen: Soft, gravid, appropriate for gestational age. Pain/Pressure: Present     Pelvic:  Cervical exam performed Dilation: Closed Effacement (%): 30 Station: Ballotable  Extremities: Normal range of motion.  Edema: None  Mental Status: Normal mood and affect. Normal behavior. Normal judgment and thought content.     Assessment   26 y.o. [id number] at [redacted]w[redacted]d by  11/27/2016, by Last  Menstrual Period presenting for routine prenatal visit.  Plan   pregnancy #5 Problems (from 10/05/16 to present)    Problem Noted Resolved   History of intrauterine fetal death, currently pregnant Oct 10, 2016 by Tresea Mall, CNM No    Fundal height measuring 36 cm. Although this could be due to change in fetal position, growth [place] ordered for next visit and asked patient to schedule it near the beginning of next week.  Induction of labor for postdates discussed. In the absence of a clinical indication for earlier induction, patient desires IOL at 41 weeks. Will schedule and notify patient.  Term labor symptoms and general obstetric precautions including but not limited to vaginal bleeding, contractions, leaking of fluid and fetal movement were reviewed in detail with the patient.  Return in about 1 week (around 12/01/2016) for ROB following ultrasound.  Marcelyn Bruins, CNM 11/24/2016  4:36 PM

## 2016-11-29 ENCOUNTER — Ambulatory Visit (INDEPENDENT_AMBULATORY_CARE_PROVIDER_SITE_OTHER): Payer: Medicaid Other | Admitting: Advanced Practice Midwife

## 2016-11-29 ENCOUNTER — Other Ambulatory Visit: Payer: Self-pay | Admitting: Maternal Newborn

## 2016-11-29 ENCOUNTER — Ambulatory Visit (INDEPENDENT_AMBULATORY_CARE_PROVIDER_SITE_OTHER): Payer: Medicaid Other

## 2016-11-29 VITALS — BP 112/78 | Wt 170.0 lb

## 2016-11-29 DIAGNOSIS — O09293 Supervision of pregnancy with other poor reproductive or obstetric history, third trimester: Secondary | ICD-10-CM

## 2016-11-29 DIAGNOSIS — Z362 Encounter for other antenatal screening follow-up: Secondary | ICD-10-CM | POA: Diagnosis not present

## 2016-11-29 DIAGNOSIS — O99333 Smoking (tobacco) complicating pregnancy, third trimester: Secondary | ICD-10-CM

## 2016-11-29 DIAGNOSIS — Z3A40 40 weeks gestation of pregnancy: Secondary | ICD-10-CM | POA: Diagnosis not present

## 2016-11-29 DIAGNOSIS — O365930 Maternal care for other known or suspected poor fetal growth, third trimester, not applicable or unspecified: Secondary | ICD-10-CM

## 2016-11-29 DIAGNOSIS — O0990 Supervision of high risk pregnancy, unspecified, unspecified trimester: Secondary | ICD-10-CM

## 2016-11-29 DIAGNOSIS — O480 Post-term pregnancy: Secondary | ICD-10-CM | POA: Diagnosis not present

## 2016-11-29 DIAGNOSIS — Z8759 Personal history of other complications of pregnancy, childbirth and the puerperium: Secondary | ICD-10-CM | POA: Diagnosis not present

## 2016-11-29 NOTE — Progress Notes (Signed)
ROB/US  

## 2016-11-29 NOTE — Progress Notes (Signed)
  Routine Prenatal Care Visit  Subjective  Kimberly Shea is a 26 y.o. [id number] at [redacted]w[redacted]d being seen today for ongoing prenatal care.  She is currently monitored for the following issues for this high-risk pregnancy and has Pregnancy, supervision, high-risk, unspecified trimester; Tobacco use; and History of intrauterine fetal death, currently pregnant on her problem list.  ----------------------------------------------------------------------------------- Patient reports no complaints.   Contractions: Irregular.  .  Movement: Present. Denies leaking of fluid.  ----------------------------------------------------------------------------------- The following portions of the patient's history were reviewed and updated as appropriate: allergies, current medications, past family history, past medical history, past social history, past surgical history and problem list. Problem list updated.   Objective  Blood pressure 112/78, weight 170 lb (77.1 kg), last menstrual period 02/21/2016 Pregravid weight 150 lb (68 kg) Total Weight Gain 20 lb (9.072 kg) Urinalysis:      Fetal Status: Fetal Heart Rate (bpm): 124   Movement: Present     Growth scan and UA Dopplers today: Baby measures 34 weeks, 5 pounds 6 ounces, UA doppler 2.58 WNL  General:  Alert, oriented and cooperative. Patient is in no acute distress.  Skin: Skin is warm and dry. No rash noted.   Cardiovascular: Normal heart rate noted  Respiratory: Normal respiratory effort, no problems with respiration noted  Abdomen: Soft, gravid, appropriate for gestational age. Pain/Pressure: Absent     Pelvic:  Cervical exam performed Dilation: 3 Effacement (%): 60 Station: -2  Extremities: Normal range of motion.  Edema: None  Mental Status: Normal mood and affect. Normal behavior. Normal judgment and thought content.   Assessment   26 y.o. [id number] at [redacted]w[redacted]d by  11/27/2016, by Last Menstrual Period presenting for routine prenatal visit  Plan    pregnancy #5 Problems (from 10/05/16 to present)    Problem Noted Resolved   History of intrauterine fetal death, currently pregnant October 20, 2016 by Tresea Mall, CNM No       Term labor symptoms and general obstetric precautions including but not limited to vaginal bleeding, contractions, leaking of fluid and fetal movement were reviewed in detail with the patient. IOL scheduled for 1200 on 12/01/2016 given finding of IUGR on growth scan today.   Tresea Mall, CNM  11/29/2016 11:39 AM

## 2016-12-01 ENCOUNTER — Inpatient Hospital Stay
Admission: EM | Admit: 2016-12-01 | Discharge: 2016-12-03 | DRG: 797 | Disposition: A | Discharge status: Home / Self Care | Payer: Medicaid Other | Attending: Obstetrics & Gynecology | Admitting: Obstetrics & Gynecology

## 2016-12-01 DIAGNOSIS — O0993 Supervision of high risk pregnancy, unspecified, third trimester: Secondary | ICD-10-CM

## 2016-12-01 DIAGNOSIS — Z3A40 40 weeks gestation of pregnancy: Secondary | ICD-10-CM | POA: Diagnosis not present

## 2016-12-01 DIAGNOSIS — O365990 Maternal care for other known or suspected poor fetal growth, unspecified trimester, not applicable or unspecified: Secondary | ICD-10-CM | POA: Diagnosis present

## 2016-12-01 DIAGNOSIS — O365930 Maternal care for other known or suspected poor fetal growth, third trimester, not applicable or unspecified: Principal | ICD-10-CM | POA: Diagnosis present

## 2016-12-01 DIAGNOSIS — Z87891 Personal history of nicotine dependence: Secondary | ICD-10-CM

## 2016-12-01 DIAGNOSIS — F1490 Cocaine use, unspecified, uncomplicated: Secondary | ICD-10-CM | POA: Diagnosis present

## 2016-12-01 DIAGNOSIS — F1290 Cannabis use, unspecified, uncomplicated: Secondary | ICD-10-CM | POA: Diagnosis present

## 2016-12-01 DIAGNOSIS — O99324 Drug use complicating childbirth: Secondary | ICD-10-CM | POA: Diagnosis present

## 2016-12-01 DIAGNOSIS — O26893 Other specified pregnancy related conditions, third trimester: Secondary | ICD-10-CM | POA: Diagnosis present

## 2016-12-01 DIAGNOSIS — Z9851 Tubal ligation status: Secondary | ICD-10-CM

## 2016-12-01 DIAGNOSIS — Z302 Encounter for sterilization: Secondary | ICD-10-CM

## 2016-12-01 DIAGNOSIS — O09299 Supervision of pregnancy with other poor reproductive or obstetric history, unspecified trimester: Secondary | ICD-10-CM

## 2016-12-01 HISTORY — DX: Other psychoactive substance abuse, uncomplicated: F19.10

## 2016-12-01 HISTORY — DX: Personal history of other complications of pregnancy, childbirth and the puerperium: Z87.59

## 2016-12-01 LAB — URINE DRUG SCREEN, QUALITATIVE (ARMC ONLY)
AMPHETAMINES, UR SCREEN: NOT DETECTED
Barbiturates, Ur Screen: NOT DETECTED
Benzodiazepine, Ur Scrn: NOT DETECTED
CANNABINOID 50 NG, UR ~~LOC~~: POSITIVE — AB
COCAINE METABOLITE, UR ~~LOC~~: NOT DETECTED
MDMA (ECSTASY) UR SCREEN: NOT DETECTED
Methadone Scn, Ur: NOT DETECTED
OPIATE, UR SCREEN: NOT DETECTED
PHENCYCLIDINE (PCP) UR S: NOT DETECTED
Tricyclic, Ur Screen: NOT DETECTED

## 2016-12-01 LAB — CBC
HCT: 38.7 % (ref 35.0–47.0)
Hemoglobin: 13.3 g/dL (ref 12.0–16.0)
MCH: 29 pg (ref 26.0–34.0)
MCHC: 34.4 g/dL (ref 32.0–36.0)
MCV: 84.4 fL (ref 80.0–100.0)
PLATELETS: 299 10*3/uL (ref 150–440)
RBC: 4.58 MIL/uL (ref 3.80–5.20)
RDW: 14 % (ref 11.5–14.5)
WBC: 11.5 10*3/uL — AB (ref 3.6–11.0)

## 2016-12-01 LAB — TYPE AND SCREEN
ABO/RH(D): O POS
Antibody Screen: NEGATIVE

## 2016-12-01 LAB — CHLAMYDIA/NGC RT PCR (ARMC ONLY)
Chlamydia Tr: NOT DETECTED
N gonorrhoeae: NOT DETECTED

## 2016-12-01 MED ORDER — FENTANYL 2.5 MCG/ML W/ROPIVACAINE 0.15% IN NS 100 ML EPIDURAL (ARMC)
EPIDURAL | Status: AC
  Filled 2016-12-01: qty 100

## 2016-12-01 MED ORDER — LIDOCAINE HCL (PF) 1 % IJ SOLN
30.0000 mL | INTRAMUSCULAR | Status: DC | PRN
  Filled 2016-12-01: qty 30

## 2016-12-01 MED ORDER — EPHEDRINE 5 MG/ML INJ
10.0000 mg | INTRAVENOUS | Status: DC | PRN
  Filled 2016-12-01: qty 2

## 2016-12-01 MED ORDER — LACTATED RINGERS IV SOLN: 500.0000 mL | INTRAVENOUS | Status: DC | PRN

## 2016-12-01 MED ORDER — ONDANSETRON HCL 4 MG/2ML IJ SOLN: 4.0000 mg | Freq: Four times a day (QID) | INTRAMUSCULAR | Status: DC | PRN

## 2016-12-01 MED ORDER — TERBUTALINE SULFATE 1 MG/ML IJ SOLN: 0.2500 mg | Freq: Once | INTRAMUSCULAR | Status: DC | PRN

## 2016-12-01 MED ORDER — MISOPROSTOL 200 MCG PO TABS
800.0000 ug | ORAL_TABLET | Freq: Once | ORAL | Status: DC | PRN
  Filled 2016-12-01: qty 4

## 2016-12-01 MED ORDER — OXYTOCIN 40 UNITS IN LACTATED RINGERS INFUSION - SIMPLE MED: 2.5000 [IU]/h | INTRAVENOUS | Status: DC

## 2016-12-01 MED ORDER — PHENYLEPHRINE 40 MCG/ML (10ML) SYRINGE FOR IV PUSH (FOR BLOOD PRESSURE SUPPORT)
80.0000 ug | PREFILLED_SYRINGE | INTRAVENOUS | Status: DC | PRN
  Filled 2016-12-01: qty 5

## 2016-12-01 MED ORDER — LACTATED RINGERS IV SOLN: 500.0000 mL | Freq: Once | INTRAVENOUS | Status: DC

## 2016-12-01 MED ORDER — OXYTOCIN 40 UNITS IN LACTATED RINGERS INFUSION - SIMPLE MED
1.0000 m[IU]/min | INTRAVENOUS | Status: DC
  Administered 2016-12-01: 10 m[IU]/min via INTRAVENOUS
  Administered 2016-12-01: 2 m[IU]/min via INTRAVENOUS
  Filled 2016-12-01: qty 1000

## 2016-12-01 MED ORDER — DIPHENHYDRAMINE HCL 50 MG/ML IJ SOLN: 12.5000 mg | INTRAMUSCULAR | Status: DC | PRN

## 2016-12-01 MED ORDER — FENTANYL 2.5 MCG/ML W/ROPIVACAINE 0.15% IN NS 100 ML EPIDURAL (ARMC)
12.0000 mL/h | EPIDURAL | Status: DC
  Administered 2016-12-02: 12 mL/h via EPIDURAL

## 2016-12-01 MED ORDER — OXYTOCIN 10 UNIT/ML IJ SOLN
INTRAMUSCULAR | Status: AC
  Filled 2016-12-01: qty 4

## 2016-12-01 MED ORDER — OXYTOCIN BOLUS FROM INFUSION: 500.0000 mL | Freq: Once | INTRAVENOUS | Status: DC

## 2016-12-01 MED ORDER — BUTORPHANOL TARTRATE 1 MG/ML IJ SOLN: 1.0000 mg | INTRAMUSCULAR | Status: DC | PRN

## 2016-12-01 MED ORDER — AMMONIA AROMATIC IN INHA
0.3000 mL | Freq: Once | RESPIRATORY_TRACT | Status: DC | PRN
  Filled 2016-12-01: qty 10

## 2016-12-01 MED ORDER — LACTATED RINGERS IV SOLN
INTRAVENOUS | Status: DC
  Administered 2016-12-01 – 2016-12-02 (×4): via INTRAVENOUS

## 2016-12-01 NOTE — Anesthesia Preprocedure Evaluation (Signed)
Anesthesia Evaluation  Patient identified by MRN, date of birth, ID band Patient awake    Reviewed: Allergy & Precautions, NPO status , Patient's Chart, lab work & pertinent test results  Airway Mallampati: II  TM Distance: >3 FB     Dental  (+) Teeth Intact   Pulmonary former smoker,    Pulmonary exam normal        Cardiovascular negative cardio ROS Normal cardiovascular exam     Neuro/Psych negative neurological ROS  negative psych ROS   GI/Hepatic negative GI ROS, (+)     substance abuse  ,   Endo/Other  negative endocrine ROS  Renal/GU negative Renal ROS  negative genitourinary   Musculoskeletal negative musculoskeletal ROS (+)   Abdominal Normal abdominal exam  (+)   Peds negative pediatric ROS (+)  Hematology negative hematology ROS (+)   Anesthesia Other Findings   Reproductive/Obstetrics (+) Pregnancy                             Anesthesia Physical Anesthesia Plan  ASA: II  Anesthesia Plan: Epidural   Post-op Pain Management:    Induction:   PONV Risk Score and Plan:   Airway Management Planned: Natural Airway  Additional Equipment:   Intra-op Plan:   Post-operative Plan:   Informed Consent: I have reviewed the patients History and Physical, chart, labs and discussed the procedure including the risks, benefits and alternatives for the proposed anesthesia with the patient or authorized representative who has indicated his/her understanding and acceptance.   Dental advisory given  Plan Discussed with: CRNA and Surgeon  Anesthesia Plan Comments:         Anesthesia Quick Evaluation

## 2016-12-01 NOTE — H&P (Signed)
OB History & Physical   History of Present Illness:  Chief Complaint:  I am here to have my baby. HPI:  Kimberly Shea is a 26 y.o. [phone] female with EDC=11/27/2016 at [redacted]w[redacted]d dated by an 18 week ultrasound.  Her pregnancy has been complicated by inadequate prenatal care, late entry to care, polysubstance abuse, and history of  an abruption anf fetal loss at 28 weeks and a hx of FGR with her last baby (4#12oz at 37 weeks).   She presents to L&D for an induction of labor for suspected fetal growth restriction. A ultrasound EFW on 10/9 was 5#6oz. AFI and Dopplers were WNL. TWG 20#. Declined 1 hour GTT. Smoker until 21 weeks. Had a positive UDS for marijuana and cocaine on 11/02/2016. She also desires a ppBTL, and has signed 30 day papers on 11/02/2016.    Prenatal care site: Prenatal care at started in [place], [place] and transferred to [facility] at 32 weeks.  Clinic [facility] Prenatal Labs  Dating 18 week ultrasound Blood type:   O POS  Genetic Screen 1 Screen:    AFP:     Quad:     NIPS: Antibody: negative  Anatomic [place] Unavailable, anterior placenta Rubella:   non immune Varicella:  Immune by 2016 labs  GTT Early:               Third trimester:  RPR:   non reactive  Rhogam  HBsAg:   negative  TDaP vaccine         06/13/2014              Flu Shot: HIV:   negative  Baby Food           Bottle                     GBS: negative  Contraception BTL, signed papers 11/02/2016 Pap:NIL  CBB     CS/VBAC    Support Person             Maternal Medical History:   Past Medical History:  Diagnosis Date  . History of prior pregnancy with IUGR newborn 2016  . History of stillbirth in currently pregnant patient 2013   at 28 weeks, multiple anomalies  . Vaginal Pap smear, abnormal     Past Surgical History:  Procedure Laterality Date  . COLPOSCOPY      No Known Allergies  Prior to Admission medications   Medication Sig Start Date End Date Taking? Authorizing Provider  Prenatal Vit-Fe Fumarate-FA  (MULTIVITAMIN-PRENATAL) 27-0.8 MG TABS tablet Take 1 tablet by mouth daily.    Yes [provider]          Social History: She  reports that she has quit smoking. Her smoking use included Cigarettes. She has quit using smokeless tobacco. She reports that she uses drugs. She reports that she does not drink alcohol.  Family History: family history includes Breast cancer in her maternal grandmother.   Review of Systems: Negative x 10 systems reviewed except as noted in the HPI.      Physical Exam:  Vital Signs: BP 119/78 (BP Location: Left Arm)   Pulse 75   Temp 97.6 F (36.4 C) (Oral)   Resp 18   Ht  (1.651 m)   Wt 75.8 kg (167 lb)   LMP 02/21/2016 (Exact Date)   BMI 27.79 kg/m  General: no acute distress.  HEENT: normocephalic, atraumatic Heart: regular rate & rhythm.  No murmurs/rubs/gallops Lungs:  clear to auscultation bilaterally Abdomen: soft, gravid, non-tender;  EFW: 6# Pelvic:   External: Normal external female genitalia  Cervix: Dilation: 3 / Effacement (%): 60 / Station: -1 / med/mid  Extremities: non-tender, symmetric, no edema bilaterally.  DTRs: +1  Neurologic: Alert & oriented x 3.   FHR 120s with accelerations to 140s, moderate variability. Had two mild variable decels with contractions, resolved when turned to side. Toco: irregular , mild contractions.     Assessment:  Kimberly Shea is a 26 y.o. [phone] female at [redacted]w[redacted]d with suspected FGR for IOL Polysubstance abuse Insufficient prenatal care Bishop score=8 Desires pp BTL  Plan:  1. Admit to Labor & Delivery -  2. CBC, T&S, Clrs, IVF, UDS 3. GBS negative.   4. Consents obtained. 5. Epidural for pain relief when appropriate 6. Pitocin induction explained to patient and she agrees with plan. 7. Bottle 8. O POS/ RNI/ VI  Farrel Conners  12/01/2016 10:59 AM

## 2016-12-01 NOTE — Progress Notes (Signed)
  Labor Progress Note   26 y.o. [id number] @ [redacted]w[redacted]d , admitted for  Pregnancy, Labor Management.   Subjective:  Min pain Ate and rested, then Pitocin restarted AROM now, clear.  Objective:  BP (!) 99/55 (BP Location: Right Arm)   Pulse 70   Temp 98.1 F (36.7 C) (Oral)   Resp 16   Ht  (1.651 m)   Wt 167 lb (75.8 kg)   LMP 02/21/2016 (Exact Date)   BMI 27.79 kg/m  Abd: mild Extr: trace to 1+ bilateral pedal edema SVE: CERVIX: 4 cm dilated, 80 effaced, -2 station  EFM: FHR: 130 bpm, variability: moderate,  accelerations:  Present,  decelerations:  Absent Toco: Frequency: Every 3-4 minutes  Assessment & Plan:  [id number] @ [redacted]w[redacted]d, admitted for  Pregnancy and Labor/Delivery Management  1. Pain management: none. 2. FWB: FHT category 1.  3. ID: GBS negative 4. Labor management: AROM and IUPC, cont Pitocin (on 10 mU/min currently)  All discussed with patient, see orders  Annamarie Major, MD, [facility], [facility] 12/01/2016  11:14 PM

## 2016-12-01 NOTE — Progress Notes (Signed)
  Labor Progress Note   26 y.o. [id number] @ [redacted]w[redacted]d , admitted for  Pregnancy, Labor Management. IOL as she is past her due date and had US findings as well as PMH of IUGR  Subjective:  Does not yet feel ctxs on Pitocin  Objective:  BP 119/78 (BP Location: Left Arm)   Pulse 75   Temp 97.6 F (36.4 C) (Oral)   Resp 18   Ht  (1.651 m)   Wt 167 lb (75.8 kg)   LMP 02/21/2016 (Exact Date)   BMI 27.79 kg/m  Abd: mild Extr: trace to 1+ bilateral pedal edema SVE: deferred currently  EFM: FHR: 140 bpm, variability: moderate,  accelerations:  Present,  decelerations:  Absent Toco: Frequency: Every 3-5 minutes Labs: I have reviewed the patient's lab results.   Assessment & Plan:  [id number] @ [redacted]w[redacted]d, admitted for  Pregnancy and Labor/Delivery Management  1. Pain management: none. 2. FWB: FHT category 1.  3. ID: GBS negative 4. Labor management: Cont Pitocin.  AROM when appropriate. Plans Epidural.  All discussed with patient, see orders  Annamarie Major, MD, [facility], [facility] 12/01/2016  1:33 PM

## 2016-12-02 ENCOUNTER — Inpatient Hospital Stay: Payer: Medicaid Other | Admitting: Anesthesiology

## 2016-12-02 DIAGNOSIS — Z3A40 40 weeks gestation of pregnancy: Secondary | ICD-10-CM

## 2016-12-02 DIAGNOSIS — O365930 Maternal care for other known or suspected poor fetal growth, third trimester, not applicable or unspecified: Secondary | ICD-10-CM

## 2016-12-02 LAB — RPR: RPR Ser Ql: NONREACTIVE

## 2016-12-02 MED ORDER — SIMETHICONE 80 MG PO CHEW: 80.0000 mg | CHEWABLE_TABLET | ORAL | Status: DC | PRN

## 2016-12-02 MED ORDER — SENNOSIDES-DOCUSATE SODIUM 8.6-50 MG PO TABS
2.0000 | ORAL_TABLET | ORAL | Status: DC
  Administered 2016-12-03: 2 via ORAL
  Filled 2016-12-02: qty 2

## 2016-12-02 MED ORDER — SODIUM CHLORIDE 0.9 % IV SOLN: 250.0000 mL | INTRAVENOUS | Status: DC | PRN

## 2016-12-02 MED ORDER — SODIUM CHLORIDE 0.9% FLUSH
3.0000 mL | Freq: Two times a day (BID) | INTRAVENOUS | Status: DC
  Administered 2016-12-02: 3 mL via INTRAVENOUS

## 2016-12-02 MED ORDER — OXYCODONE-ACETAMINOPHEN 5-325 MG PO TABS
1.0000 | ORAL_TABLET | ORAL | Status: DC | PRN
  Administered 2016-12-02 – 2016-12-03 (×3): 1 via ORAL
  Filled 2016-12-02 (×3): qty 1

## 2016-12-02 MED ORDER — DIPHENHYDRAMINE HCL 25 MG PO CAPS: 25.0000 mg | ORAL_CAPSULE | Freq: Four times a day (QID) | ORAL | Status: DC | PRN

## 2016-12-02 MED ORDER — ONDANSETRON HCL 4 MG PO TABS: 4.0000 mg | ORAL_TABLET | ORAL | Status: DC | PRN

## 2016-12-02 MED ORDER — IBUPROFEN 400 MG PO TABS
600.0000 mg | ORAL_TABLET | Freq: Four times a day (QID) | ORAL | Status: DC
  Administered 2016-12-02 – 2016-12-03 (×5): 600 mg via ORAL
  Filled 2016-12-02 (×6): qty 1

## 2016-12-02 MED ORDER — IBUPROFEN 600 MG PO TABS
ORAL_TABLET | ORAL | Status: AC
  Filled 2016-12-02: qty 1

## 2016-12-02 MED ORDER — COCONUT OIL OIL: 1.0000 "application " | TOPICAL_OIL | Status: DC | PRN

## 2016-12-02 MED ORDER — DIBUCAINE 1 % RE OINT: 1.0000 "application " | TOPICAL_OINTMENT | RECTAL | Status: DC | PRN

## 2016-12-02 MED ORDER — BENZOCAINE-MENTHOL 20-0.5 % EX AERO: 1.0000 "application " | INHALATION_SPRAY | CUTANEOUS | Status: DC | PRN

## 2016-12-02 MED ORDER — DEXTROSE 5 % IV SOLN
2.0000 g | Freq: Two times a day (BID) | INTRAVENOUS | Status: AC
  Administered 2016-12-02 – 2016-12-03 (×2): 2 g via INTRAVENOUS
  Filled 2016-12-02 (×2): qty 20

## 2016-12-02 MED ORDER — LIDOCAINE HCL (PF) 2 % IJ SOLN
INTRAMUSCULAR | Status: DC | PRN
  Administered 2016-12-02: 7 mL via INTRADERMAL

## 2016-12-02 MED ORDER — CEFAZOLIN SODIUM-DEXTROSE 2-3 GM-% IV SOLR: 2.0000 g | Freq: Two times a day (BID) | INTRAVENOUS | Status: DC

## 2016-12-02 MED ORDER — SOD CITRATE-CITRIC ACID 500-334 MG/5ML PO SOLN
30.0000 mL | ORAL | Status: DC
  Filled 2016-12-02: qty 15

## 2016-12-02 MED ORDER — ACETAMINOPHEN 325 MG PO TABS: 650.0000 mg | ORAL_TABLET | ORAL | Status: DC | PRN

## 2016-12-02 MED ORDER — TETANUS-DIPHTH-ACELL PERTUSSIS 5-2.5-18.5 LF-MCG/0.5 IM SUSP
0.5000 mL | Freq: Once | INTRAMUSCULAR | Status: AC
  Administered 2016-12-03: 0.5 mL via INTRAMUSCULAR
  Filled 2016-12-02: qty 0.5

## 2016-12-02 MED ORDER — SODIUM CHLORIDE 0.9% FLUSH
3.0000 mL | INTRAVENOUS | Status: DC | PRN
Start: 2016-12-02 — End: 2016-12-03

## 2016-12-02 MED ORDER — ZOLPIDEM TARTRATE 5 MG PO TABS: 5.0000 mg | ORAL_TABLET | Freq: Every evening | ORAL | Status: DC | PRN

## 2016-12-02 MED ORDER — LIDOCAINE-EPINEPHRINE (PF) 1.5 %-1:200000 IJ SOLN
INTRAMUSCULAR | Status: DC | PRN
Start: 2016-12-02 — End: 2016-12-02
  Administered 2016-12-02: 3 mL via PERINEURAL

## 2016-12-02 MED ORDER — WITCH HAZEL-GLYCERIN EX PADS: 1.0000 "application " | MEDICATED_PAD | CUTANEOUS | Status: DC | PRN

## 2016-12-02 MED ORDER — BUPIVACAINE HCL (PF) 0.25 % IJ SOLN
INTRAMUSCULAR | Status: DC | PRN
Start: 2016-12-02 — End: 2016-12-02
  Administered 2016-12-02: 10 mL via EPIDURAL

## 2016-12-02 MED ORDER — OXYCODONE-ACETAMINOPHEN 5-325 MG PO TABS
2.0000 | ORAL_TABLET | ORAL | Status: DC | PRN
  Administered 2016-12-03: 2 via ORAL
  Filled 2016-12-02: qty 2

## 2016-12-02 MED ORDER — MEASLES, MUMPS & RUBELLA VAC ~~LOC~~ INJ
0.5000 mL | INJECTION | Freq: Once | SUBCUTANEOUS | Status: DC
  Filled 2016-12-02: qty 0.5

## 2016-12-02 MED ORDER — LACTATED RINGERS IV SOLN: INTRAVENOUS | Status: DC

## 2016-12-02 MED ORDER — ONDANSETRON HCL 4 MG/2ML IJ SOLN: 4.0000 mg | INTRAMUSCULAR | Status: DC | PRN

## 2016-12-02 MED ORDER — LIDOCAINE HCL (PF) 1 % IJ SOLN
INTRAMUSCULAR | Status: DC | PRN
  Administered 2016-12-02: 3 mL

## 2016-12-02 NOTE — Discharge Summary (Signed)
OB Discharge Summary     Patient Name: Kimberly Shea DOB: 06-09-1990 MRN: [id number]  Date of admission: 12/01/2016 Delivering MD: Letitia Libra, MD  Date of Delivery: 12/01/2016  Date of discharge: 12/03/2016  Admitting diagnosis: 40 wks preg DESIRES STERILIZATION Intrauterine pregnancy: [redacted]w[redacted]d     Secondary diagnosis: Delayed prenatal care, substance use, IUGR     Discharge diagnosis: Term Pregnancy Delivered, desires permanent sterilization  Hospital course:  Induction of Labor With Vaginal Delivery   26 y.o. yo [id number] at [redacted]w[redacted]d was admitted to the hospital 12/01/2016 for induction of labor.  Indication for induction: IUGR after 40 weeks.  Patient had an uncomplicated labor course as follows: Membrane Rupture Time/Date: 11:12 PM ,12/01/2016   Intrapartum Procedures: Episiotomy: None [1]                                         Lacerations:  None [1]  Patient had delivery of a Viable infant.  Information for the patient's newborn:  Tamlyn, Sides [[id number]]  Delivery Method: Vaginal, Spontaneous Delivery (Filed from Delivery Summary)   12/02/2016  Details of delivery can be found in separate delivery note.  Patient's postpartum course was notable for tubal ligation that occurred without incident.  She recovered well after the surgery and was meeting discharge criteria of ambulating, tolerating, PO, pain controlled well on PO pain medication, and voiding without difficulty.  Patient is discharged home 12/03/16.                                                                 Post partum procedures:postpartum tubal ligation  Complications: None  Physical exam on 12/03/2016: Vitals:   12/03/16 1004 12/03/16 1013 12/03/16 1028 12/03/16 1050  BP:  114/80 116/71 111/79  Pulse: 85 72 64 68  Resp: Temp:  (!) 97.4 F (36.3 C)    TempSrc:      SpO2: 96% 97% 97% 99%  Weight:      Height:       General: alert, cooperative and no distress Lochia:  appropriate Uterine Fundus: firm Incision: Healing well with no significant drainage, Dressing is clean, dry, and intact DVT Evaluation: No evidence of DVT seen on physical exam. No cords or calf tenderness. No significant calf/ankle edema.  Labs: Lab Results  Component Value Date   WBC 17.2 (H) 12/03/2016   HGB 12.3 12/03/2016   HCT 36.2 12/03/2016   MCV 86.5 12/03/2016   PLT 265 12/03/2016   No flowsheet data found.  Discharge instruction: per After Visit Summary.  Medications:  Allergies as of 12/03/2016   No Known Allergies     Medication List    TAKE these medications   ibuprofen 600 MG tablet Commonly known as:  ADVIL,MOTRIN Take 1 tablet (600 mg total) by mouth every 6 (six) hours.   multivitamin-prenatal 27-0.8 MG Tabs tablet Take 1 tablet by mouth daily.   oxyCODONE-acetaminophen 5-325 MG tablet Commonly known as:  PERCOCET/ROXICET Take 1 tablet by mouth every 4 (four) hours as needed (breakthrough pain).            Discharge Care Instructions  Start     Ordered   12/03/16 0000  Discharge wound care:    Comments:  Perform wound care instructions   12/03/16 1435      Diet: routine diet  Activity: Advance as tolerated. Pelvic rest for 6 weeks.   Outpatient follow up: Follow-up Information    Nadara Mustard, MD. Schedule an appointment as soon as possible for a visit in 6 week(s).   Specialty:  Obstetrics and Gynecology Contact information: [address] [place] [address] [phone]             Postpartum contraception: Tubal Ligation Rhogam Given postpartum: no Rubella vaccine given postpartum: yes Varicella vaccine given postpartum: no TDaP given antepartum or postpartum: offered postpartum  Newborn Data: Live born unspecified sex  Birth Weight:   APGAR: ,   Newborn Delivery   Birth date/time:  12/02/2016 06:02:00 Delivery type:  Vaginal, Spontaneous Delivery       Baby Feeding:  formula  Disposition:home with mother  SIGNED: Thomasene Mohair, MD 12/03/2016 2:37 PM

## 2016-12-02 NOTE — Progress Notes (Signed)
Epidural tubing disconnected, pt delivered about 15 min after noticed. Instructed to remove epidural

## 2016-12-02 NOTE — Anesthesia Procedure Notes (Signed)
Epidural Patient location during procedure: OB Start time: 12/02/2016 12:09 AM End time: 12/02/2016 12:20 AM  Staffing Anesthesiologist: Yves Dill Performed: anesthesiologist   Preanesthetic Checklist Completed: patient identified, site marked, surgical consent, pre-op evaluation, timeout performed, IV checked, risks and benefits discussed and monitors and equipment checked  Epidural Patient position: sitting Prep: Betadine Patient monitoring: heart rate, continuous pulse ox and blood pressure Approach: midline Location: L3-L4 Injection technique: LOR air  Needle:  Needle type: Tuohy  Needle gauge: 17 G Needle length: 9 cm and 9 Catheter type: closed end flexible Catheter size: 19 Gauge Test dose: negative and 1.5% lidocaine with Epi 1:200 K  Assessment Events: blood not aspirated, injection not painful, no injection resistance, negative IV test and no paresthesia  Additional Notes Time out called.  Patient placed in sitting position. Prepped and draped in sterile fashion.  A skin wheal was made in the L3-L4 interspace with 1% lidocaine plain.  A 17G Tuohy needle was introduced into the epidural space by a loss of resistence technique.  No blood or paresthesias.  The epidural catheter was threaded 3 cm and the TD was negative.  The catheter was affixed to the back in sterile fashion.  The patient tolerated the procedure well.Reason for block:procedure for pain

## 2016-12-02 NOTE — Anesthesia Postprocedure Evaluation (Signed)
Anesthesia Post Note  Patient: Kimberly Shea  Procedure(s) Performed: AN AD HOC LABOR EPIDURAL  Patient location during evaluation: Mother Baby Anesthesia Type: Epidural Level of consciousness: awake and alert and oriented Pain management: pain level controlled Vital Signs Assessment: post-procedure vital signs reviewed and stable Respiratory status: spontaneous breathing Cardiovascular status: blood pressure returned to baseline Postop Assessment: no headache and no backache Anesthetic complications: no Comments: I was called by labor and delivery after delivery, and after the fact, that the epidural had just come undone right before delivery at the tubing and connector site and the nurses had reconnected without wiping the tube down in sterile fashion and cutting the tube in sterile fashion before reconnection.  I instructed them that in the future that they contact anesthesia immediately so that we can properly reconnect the line, if at all.      Just prior to delivery, the patient had been getting good relief.     Last Vitals:  Vitals:   12/02/16 0625 12/02/16 0640  BP: 132/81 (!) 147/79  Pulse: 82 90  Resp:    Temp:    SpO2: 98%     Last Pain:  Vitals:   12/02/16 0615  TempSrc:   PainSc: 8                  Jaquelynn Wanamaker

## 2016-12-02 NOTE — Discharge Instructions (Signed)

## 2016-12-03 ENCOUNTER — Encounter
Admission: EM | Disposition: A | Discharge status: Home / Self Care | Payer: Self-pay | Source: Home / Self Care | Attending: Obstetrics & Gynecology

## 2016-12-03 ENCOUNTER — Inpatient Hospital Stay: Payer: Medicaid Other | Admitting: Anesthesiology

## 2016-12-03 DIAGNOSIS — Z302 Encounter for sterilization: Secondary | ICD-10-CM

## 2016-12-03 DIAGNOSIS — Z9851 Tubal ligation status: Secondary | ICD-10-CM

## 2016-12-03 HISTORY — PX: TUBAL LIGATION: SHX77

## 2016-12-03 LAB — CBC
HEMATOCRIT: 36.2 % (ref 35.0–47.0)
HEMOGLOBIN: 12.3 g/dL (ref 12.0–16.0)
MCH: 29.4 pg (ref 26.0–34.0)
MCHC: 33.9 g/dL (ref 32.0–36.0)
MCV: 86.5 fL (ref 80.0–100.0)
Platelets: 265 10*3/uL (ref 150–440)
RBC: 4.18 MIL/uL (ref 3.80–5.20)
RDW: 14 % (ref 11.5–14.5)
WBC: 17.2 10*3/uL — ABNORMAL HIGH (ref 3.6–11.0)

## 2016-12-03 SURGERY — LIGATION, FALLOPIAN TUBE, POSTPARTUM
Anesthesia: General | Laterality: Bilateral

## 2016-12-03 MED ORDER — ONDANSETRON HCL 4 MG/2ML IJ SOLN
INTRAMUSCULAR | Status: DC | PRN
  Administered 2016-12-03: 4 mg via INTRAVENOUS

## 2016-12-03 MED ORDER — FENTANYL CITRATE (PF) 100 MCG/2ML IJ SOLN
INTRAMUSCULAR | Status: AC
  Filled 2016-12-03: qty 2

## 2016-12-03 MED ORDER — FENTANYL CITRATE (PF) 100 MCG/2ML IJ SOLN
25.0000 ug | INTRAMUSCULAR | Status: DC | PRN
  Administered 2016-12-03 (×4): 25 ug via INTRAVENOUS

## 2016-12-03 MED ORDER — LACTATED RINGERS IV SOLN
INTRAVENOUS | Status: DC | PRN
  Administered 2016-12-03: 09:00:00 via INTRAVENOUS

## 2016-12-03 MED ORDER — HYDROMORPHONE HCL 1 MG/ML IJ SOLN: 1.0000 mg | INTRAMUSCULAR | Status: DC | PRN

## 2016-12-03 MED ORDER — LACTATED RINGERS IV SOLN: INTRAVENOUS | Status: DC

## 2016-12-03 MED ORDER — FENTANYL CITRATE (PF) 100 MCG/2ML IJ SOLN
INTRAMUSCULAR | Status: DC | PRN
  Administered 2016-12-03: 50 ug via INTRAVENOUS

## 2016-12-03 MED ORDER — ONDANSETRON HCL 4 MG/2ML IJ SOLN: 4.0000 mg | Freq: Once | INTRAMUSCULAR | Status: DC | PRN

## 2016-12-03 MED ORDER — IBUPROFEN 600 MG PO TABS: 600.0000 mg | ORAL_TABLET | Freq: Four times a day (QID) | ORAL | 0 refills | Status: AC

## 2016-12-03 MED ORDER — ROCURONIUM BROMIDE 100 MG/10ML IV SOLN
INTRAVENOUS | Status: DC | PRN
  Administered 2016-12-03: 20 mg via INTRAVENOUS

## 2016-12-03 MED ORDER — PROPOFOL 10 MG/ML IV BOLUS
INTRAVENOUS | Status: DC | PRN
  Administered 2016-12-03: 140 mg via INTRAVENOUS

## 2016-12-03 MED ORDER — SUCCINYLCHOLINE CHLORIDE 20 MG/ML IJ SOLN
INTRAMUSCULAR | Status: DC | PRN
  Administered 2016-12-03: 100 mg via INTRAVENOUS

## 2016-12-03 MED ORDER — BUPIVACAINE-EPINEPHRINE (PF) 0.25% -1:200000 IJ SOLN
INTRAMUSCULAR | Status: AC
  Filled 2016-12-03: qty 30

## 2016-12-03 MED ORDER — OXYCODONE-ACETAMINOPHEN 5-325 MG PO TABS: 1.0000 | ORAL_TABLET | ORAL | 0 refills | Status: AC | PRN

## 2016-12-03 MED ORDER — LIDOCAINE HCL (CARDIAC) 20 MG/ML IV SOLN
INTRAVENOUS | Status: DC | PRN
  Administered 2016-12-03: 50 mg via INTRAVENOUS

## 2016-12-03 MED ORDER — SUGAMMADEX SODIUM 500 MG/5ML IV SOLN
INTRAVENOUS | Status: DC | PRN
  Administered 2016-12-03: 150 mg via INTRAVENOUS

## 2016-12-03 SURGICAL SUPPLY — 26 items
BLADE SURG SZ11 CARB STEEL (BLADE) ×3 IMPLANT
CHLORAPREP W/TINT 26ML (MISCELLANEOUS) ×3 IMPLANT
DERMABOND ADVANCED (GAUZE/BANDAGES/DRESSINGS) ×2
DERMABOND ADVANCED .7 DNX12 (GAUZE/BANDAGES/DRESSINGS) ×1 IMPLANT
DRAPE LAPAROTOMY 77X122 PED (DRAPES) ×3 IMPLANT
ELECT CAUTERY BLADE 6.4 (BLADE) ×3 IMPLANT
ELECT REM PT RETURN 9FT ADLT (ELECTROSURGICAL) ×3
ELECTRODE REM PT RTRN 9FT ADLT (ELECTROSURGICAL) ×1 IMPLANT
GLOVE BIO SURGEON STRL SZ7 (GLOVE) ×3 IMPLANT
GLOVE BIOGEL PI IND STRL 7.5 (GLOVE) ×1 IMPLANT
GLOVE BIOGEL PI INDICATOR 7.5 (GLOVE) ×2
GOWN STRL REUS W/ TWL LRG LVL3 (GOWN DISPOSABLE) ×2 IMPLANT
GOWN STRL REUS W/ TWL XL LVL3 (GOWN DISPOSABLE) ×1 IMPLANT
GOWN STRL REUS W/TWL LRG LVL3 (GOWN DISPOSABLE) ×4
GOWN STRL REUS W/TWL XL LVL3 (GOWN DISPOSABLE) ×2
KIT RM TURNOVER CYSTO AR (KITS) ×3 IMPLANT
LABEL OR SOLS (LABEL) ×3 IMPLANT
NDL SAFETY 22GX1.5 (NEEDLE) ×3 IMPLANT
NS IRRIG 500ML POUR BTL (IV SOLUTION) ×3 IMPLANT
PACK BASIN MINOR ARMC (MISCELLANEOUS) ×3 IMPLANT
SUT MNCRL 4-0 (SUTURE) ×2
SUT MNCRL 4-0 27XMFL (SUTURE) ×1
SUT PLAIN GUT 0 (SUTURE) ×6 IMPLANT
SUT VIC AB 2-0 UR6 27 (SUTURE) ×3 IMPLANT
SUTURE MNCRL 4-0 27XMF (SUTURE) ×1 IMPLANT
SYRINGE 10CC LL (SYRINGE) ×3 IMPLANT

## 2016-12-03 NOTE — Anesthesia Postprocedure Evaluation (Signed)
Anesthesia Post Note  Patient: Kimberly Shea  Procedure(s) Performed: POST PARTUM TUBAL LIGATION (Bilateral )  Patient location during evaluation: PACU Anesthesia Type: General Level of consciousness: awake and alert Pain management: pain level controlled Vital Signs Assessment: post-procedure vital signs reviewed and stable Respiratory status: spontaneous breathing and respiratory function stable Cardiovascular status: stable Anesthetic complications: no     Last Vitals:  Vitals:   12/03/16 0955 12/03/16 0958  BP:  115/79  Pulse: 78 70  Resp: 13 14  Temp:    SpO2: 97% 95%    Last Pain:  Vitals:   12/03/16 0958  TempSrc:   PainSc: 7                  KEPHART,WILLIAM K

## 2016-12-03 NOTE — Anesthesia Post-op Follow-up Note (Signed)
Anesthesia QCDR form completed.        

## 2016-12-03 NOTE — Op Note (Signed)
Op Note Postpartum Tubal Ligation  Pre-Op Diagnosis: multiparity, desires permanent sterilization  Post-Op Diagnosis: multiparity, desires permanent sterilization  Procedures:  Postpartum tubal ligation via Pomeroy method  Primary Surgeon: Thomasene Mohair, MD  EBL: 3 ml   IVF: 400 mL   Specimens: portion of right and left fallopian tubes  Drains: None  Complications: None   Disposition: PACU   Condition: Stable   Findings: normal appearing bilateral fallopian tubes  Indication: The patient is a 26 y.o. [id number] who is postpartum day 1 status post spontaneous vaginal delivery.  She has been counseled extensively regarding risks, benefits, and alternatives to tubal ligation, including non-permanent forms of contraception that are equivalent in efficacy with potentially better side effects.  She has been advised that there is a failure rate of 3-5 in every 1,000 tubal ligations per year with an increased risk of ectopic pregnancy should pregnancy occur. She has further been advised that there is a high risk of regret, especially in younger women.    Procedure Summary:  The patient was taken to the operating room where general anesthesia was administered and found to be adequate. After timeout was called a small transverse, infraumbilical incision was made with the scalpel. The incision was carried down through the fascia until the peritoneum was identified and entered. The peritoneum was noted to be free of any adhesions and the incision was then extended.  The patient's left fallopian tube was identified, brought incision, and grasped with a Babcock clamp. The tube was then followed out to the fimbria. The Babcock clamp was then used to grasp the tube approximately 4 cm from the cornual region. A 3 cm segment of tube was then ligated with the 2 free ties of plain gut, and excised. Good hemostasis was noted and the tube was returned to the abdomen. In a similar fashion, the right  fallopian tube was then ligated, and a 3 cm segment excised in a similar fashion. Excellent hemostasis was noted, and the tube returned to the abdomen.  The fascia was closed in a single layer using 2-0 Vicryl. The skin was closed in a subcuticular fashion using 4-0 vicryl, undyed. The closure was also closed with Dermabond.  Sponge, lap, needle, and instrument counts were correct x 2.  VTE prophylaxis: SCDs. Antibiotic prophylaxis: none indicated and none given. The patient tolerated the procedure well and was taken to the PACU in stable condition.   Thomasene Mohair, MD 12/03/2016 12:37 PM

## 2016-12-03 NOTE — Anesthesia Post-op Follow-up Note (Incomplete)
Anesthesia QCDR form completed.        

## 2016-12-03 NOTE — Progress Notes (Signed)
Patient discharged education provided. Concerns addressed. Prescriptions provided. Patient instructed to call on Monday (12/05/16) to make appointment for 6 week follow-up. Patient educated on appropriate care for new-born. IV site removed. Patient awaiting ride for discharge continue to assess.

## 2016-12-03 NOTE — Progress Notes (Signed)
Reviewed all patients discharge instructions and handouts regarding postpartum bleeding, no intercourse for 6 weeks, signs and symptoms of mastitis and postpartum bleu's. Reviewed discharge instructions for newborn regarding proper cord care, how and when to bathe the newborn, nail care, proper way to take the baby's temperature, along with safe sleep. All questions have been answered at this time. Patient discharged via wheelchair with RN.  

## 2016-12-03 NOTE — Progress Notes (Signed)
Patient ID: CAROLYNN TULEY, female   DOB: October 26, 1990, 26 y.o.   MRN: [id number]  Obstetric Postpartum Daily Progress Note Subjective:  26 y.o. [id number] postpartum day #1 status post vaginal delivery.  She is ambulating, is tolerating po (though NPO since midnight), is voiding spontaneously.  Her pain is well controlled on PO pain medications. Her lochia is less than menses.   Medications SCHEDULED MEDICATIONS  . ibuprofen  600 mg Oral Q6H  . measles, mumps and rubella vaccine  0.5 mL Subcutaneous Once  . senna-docusate  2 tablet Oral Q24H  . sodium chloride flush  3 mL Intravenous Q12H  . sodium citrate-citric acid  30 mL Oral On Call to OR  . Tdap  0.5 mL Intramuscular Once    MEDICATION INFUSIONS  . sodium chloride      PRN MEDICATIONS  sodium chloride flush **AND** sodium chloride flush **AND** sodium chloride, acetaminophen, benzocaine-Menthol, coconut oil, witch hazel-glycerin **AND** dibucaine, diphenhydrAMINE, ondansetron **OR** ondansetron (ZOFRAN) IV, oxyCODONE-acetaminophen, oxyCODONE-acetaminophen, simethicone, zolpidem    Objective:   Vitals:   12/02/16 1912 12/03/16 0000 12/03/16 0301 12/03/16 0800  BP: 114/70 121/75 113/74 110/70  Pulse: 79 74 72 62  Resp: Temp: 97.7 F (36.5 C) 98 F (36.7 C) 97.7 F (36.5 C) 97.7 F (36.5 C)  TempSrc: Oral Oral Oral Oral  SpO2: 100% 98% 98% 100%  Weight:      Height:        Current Vital Signs 24h Vital Sign Ranges  T 97.7 F (36.5 C) Temp  Avg: 97.9 F (36.6 C)  Min: 97.7 F (36.5 C)  Max: 98.3 F (36.8 C)  BP 110/70 BP  Min: 106/64  Max: 121/75  HR 62 Pulse  Avg: 72.2  Min: 62  Max: 79  RR 18 Resp  Avg: 17.2  Min: 16  Max: 18  SaO2 100 % Not Delivered SpO2  Avg: 99 %  Min: 98 %  Max: 100 %       24 Hour I/O Current Shift I/O  Time Ins Outs 10/12 0701 - 10/13 0700 In: 360 [P.O.:360] Out: 250 [Urine:250] No intake/output data recorded.  General: NAD Pulmonary: no increased work of breathing,  CTAB CV: RRR Abdomen: non-distended, non-tender, fundus firm at level of umbilicus-1cm Extremities: no edema, no erythema, no tenderness  Labs:   Recent Labs Lab 12/01/16 0936 12/03/16 0508  WBC 11.5* 17.2*  HGB 13.3 12.3  HCT 38.7 36.2  PLT 299 265     Assessment:   26 y.o. [id number] postpartum day # 1 status post SVD, desires permanent sterility.   Plan:   1) Desires permanent sterility: to OR for postpartum bilateral tubal ligation.   Other reversible forms of contraception were discussed with patient; she declines all other modalities. Permanent nature of as well as associated risks of the procedure discussed with patient including but not limited to: risk of regret, permanence of method, bleeding, infection, injury to surrounding organs and need for additional procedures.  Failure risk of 0.5-1% with increased risk of ectopic gestation if pregnancy occurs was also discussed with patient.    2) O POS / Rubella non-immune/ Varicella Immune  3) TDAP status: did not receive  4) formula feeding /Contraception = bilateral tubal ligation  5) Disposition: home later today, if stable  Thomasene Mohair, MD 12/03/2016 8:22 AM

## 2016-12-03 NOTE — Anesthesia Preprocedure Evaluation (Signed)
Anesthesia Evaluation  Patient identified by MRN, date of birth, ID band Patient awake    Reviewed: Allergy & Precautions, NPO status , Patient's Chart, lab work & pertinent test results  Airway Mallampati: II  TM Distance: >3 FB     Dental  (+) Teeth Intact   Pulmonary former smoker,    Pulmonary exam normal        Cardiovascular negative cardio ROS Normal cardiovascular exam     Neuro/Psych negative neurological ROS  negative psych ROS   GI/Hepatic negative GI ROS, (+)     substance abuse  ,   Endo/Other  negative endocrine ROS  Renal/GU negative Renal ROS  negative genitourinary   Musculoskeletal negative musculoskeletal ROS (+)   Abdominal Normal abdominal exam  (+)   Peds negative pediatric ROS (+)  Hematology negative hematology ROS (+)   Anesthesia Other Findings   Reproductive/Obstetrics (+) Pregnancy                             Anesthesia Physical  Anesthesia Plan  ASA: II  Anesthesia Plan: General   Post-op Pain Management:    Induction:   PONV Risk Score and Plan: 3 and Ondansetron, Dexamethasone, Midazolam and Treatment may vary due to age or medical condition  Airway Management Planned: Oral ETT  Additional Equipment:   Intra-op Plan:   Post-operative Plan:   Informed Consent: I have reviewed the patients History and Physical, chart, labs and discussed the procedure including the risks, benefits and alternatives for the proposed anesthesia with the patient or authorized representative who has indicated his/her understanding and acceptance.   Dental advisory given  Plan Discussed with: CRNA and Surgeon  Anesthesia Plan Comments:         Anesthesia Quick Evaluation

## 2016-12-03 NOTE — Transfer of Care (Signed)
Immediate Anesthesia Transfer of Care Note  Patient: Kimberly Shea  Procedure(s) Performed: POST PARTUM TUBAL LIGATION (Bilateral )  Patient Location: PACU  Anesthesia Type:General  Level of Consciousness: awake, alert  and oriented  Airway & Oxygen Therapy: Patient Spontanous Breathing  Post-op Assessment: Report given to RN  Post vital signs: Reviewed and stable  Last Vitals:  Vitals:   12/03/16 0301 12/03/16 0800  BP: 113/74 110/70  Pulse: 72 62  Resp: 18 18  Temp: 36.5 C 36.5 C  SpO2: 98% 100%    Last Pain:  Vitals:   12/03/16 0800  TempSrc: Oral  PainSc:          Complications: No apparent anesthesia complications

## 2016-12-03 NOTE — Anesthesia Procedure Notes (Signed)
Procedure Name: Intubation Date/Time: 12/03/2016 8:57 AM Performed by: [id number], Grizel Vesely Pre-anesthesia Checklist: Timeout performed, Patient being monitored, Suction available, Emergency Drugs available and Patient identified Patient Re-evaluated:Patient Re-evaluated prior to induction Oxygen Delivery Method: Circle system utilized Preoxygenation: Pre-oxygenation with 100% oxygen Induction Type: IV induction Laryngoscope Size: Mac and 3 Grade View: Grade I Tube type: Oral Tube size: 7.0 mm Number of attempts: 1 Placement Confirmation: breath sounds checked- equal and bilateral,  CO2 detector,  positive ETCO2 and ETT inserted through vocal cords under direct vision Secured at: 21 cm Tube secured with: Tape

## 2016-12-04 ENCOUNTER — Encounter: Payer: Self-pay | Admitting: Obstetrics and Gynecology

## 2016-12-05 LAB — SURGICAL PATHOLOGY

## 2016-12-06 LAB — SURGICAL PATHOLOGY
